# Patient Record
Sex: Male | Born: 1985 | Race: Black or African American | Hispanic: No | Marital: Single | Smoking: Current every day smoker
Health system: Southern US, Community
[De-identification: ages and names within clinical notes are randomized; demographics above are authoritative.]

## PROBLEM LIST (undated history)

## (undated) DIAGNOSIS — I319 Disease of pericardium, unspecified: Secondary | ICD-10-CM

## (undated) HISTORY — PX: ANKLE FRACTURE SURGERY: SHX122

## (undated) HISTORY — DX: Disease of pericardium, unspecified: I31.9

---

## 2013-12-19 ENCOUNTER — Encounter: Payer: Self-pay | Admitting: Internal Medicine

## 2013-12-19 ENCOUNTER — Ambulatory Visit: Payer: No Typology Code available for payment source | Attending: Internal Medicine | Admitting: Internal Medicine

## 2013-12-19 VITALS — BP 114/74 | HR 68 | Temp 98.1°F | Resp 16 | Ht 73.0 in | Wt 170.0 lb

## 2013-12-19 DIAGNOSIS — S82892A Other fracture of left lower leg, initial encounter for closed fracture: Secondary | ICD-10-CM

## 2013-12-19 DIAGNOSIS — M25579 Pain in unspecified ankle and joints of unspecified foot: Secondary | ICD-10-CM | POA: Insufficient documentation

## 2013-12-19 LAB — COMPREHENSIVE METABOLIC PANEL
ALT: 15 U/L (ref 0–53)
AST: 13 U/L (ref 0–37)
Albumin: 4.4 g/dL (ref 3.5–5.2)
Alkaline Phosphatase: 71 U/L (ref 39–117)
BILIRUBIN TOTAL: 0.2 mg/dL (ref 0.2–1.2)
BUN: 12 mg/dL (ref 6–23)
CO2: 30 meq/L (ref 19–32)
CREATININE: 0.94 mg/dL (ref 0.50–1.35)
Calcium: 9.4 mg/dL (ref 8.4–10.5)
Chloride: 103 mEq/L (ref 96–112)
Glucose, Bld: 89 mg/dL (ref 70–99)
Potassium: 4.1 mEq/L (ref 3.5–5.3)
Sodium: 140 mEq/L (ref 135–145)
Total Protein: 7.3 g/dL (ref 6.0–8.3)

## 2013-12-19 LAB — CBC WITH DIFFERENTIAL/PLATELET
Basophils Absolute: 0.1 10*3/uL (ref 0.0–0.1)
Basophils Relative: 1 % (ref 0–1)
EOS ABS: 0.2 10*3/uL (ref 0.0–0.7)
EOS PCT: 3 % (ref 0–5)
HCT: 42.3 % (ref 39.0–52.0)
Hemoglobin: 14.5 g/dL (ref 13.0–17.0)
LYMPHS ABS: 2.5 10*3/uL (ref 0.7–4.0)
Lymphocytes Relative: 39 % (ref 12–46)
MCH: 30.7 pg (ref 26.0–34.0)
MCHC: 34.3 g/dL (ref 30.0–36.0)
MCV: 89.6 fL (ref 78.0–100.0)
Monocytes Absolute: 0.8 10*3/uL (ref 0.1–1.0)
Monocytes Relative: 12 % (ref 3–12)
Neutro Abs: 2.8 10*3/uL (ref 1.7–7.7)
Neutrophils Relative %: 45 % (ref 43–77)
PLATELETS: 199 10*3/uL (ref 150–400)
RBC: 4.72 MIL/uL (ref 4.22–5.81)
RDW: 13.7 % (ref 11.5–15.5)
WBC: 6.3 10*3/uL (ref 4.0–10.5)

## 2013-12-19 LAB — SEDIMENTATION RATE: SED RATE: 1 mm/h (ref 0–16)

## 2013-12-19 LAB — C-REACTIVE PROTEIN

## 2013-12-19 MED ORDER — NAPROXEN 500 MG PO TABS: 500.0000 mg | ORAL_TABLET | Freq: Two times a day (BID) | ORAL | Status: DC

## 2013-12-19 MED ORDER — CIPROFLOXACIN HCL 500 MG PO TABS: 500.0000 mg | ORAL_TABLET | Freq: Two times a day (BID) | ORAL | Status: DC

## 2013-12-19 MED ORDER — DOXYCYCLINE HYCLATE 100 MG PO TABS
100.0000 mg | ORAL_TABLET | Freq: Two times a day (BID) | ORAL | Status: DC
Start: 2013-12-19 — End: 2014-03-19

## 2013-12-19 MED ORDER — TRAMADOL HCL 50 MG PO TABS: 50.0000 mg | ORAL_TABLET | Freq: Four times a day (QID) | ORAL | Status: DC | PRN
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
## 2013-12-20 ENCOUNTER — Ambulatory Visit (HOSPITAL_COMMUNITY)
Admission: EM | Admit: 2013-12-20 | Payer: No Typology Code available for payment source | Source: Ambulatory Visit | Admitting: Internal Medicine

## 2013-12-20 ENCOUNTER — Ambulatory Visit (HOSPITAL_COMMUNITY)
Admission: RE | Admit: 2013-12-20 | Discharge: 2013-12-20 | Disposition: A | Discharge status: Home / Self Care | Payer: No Typology Code available for payment source | Source: Ambulatory Visit | Attending: Internal Medicine | Admitting: Internal Medicine

## 2013-12-20 DIAGNOSIS — S82892A Other fracture of left lower leg, initial encounter for closed fracture: Secondary | ICD-10-CM

## 2013-12-20 DIAGNOSIS — M25579 Pain in unspecified ankle and joints of unspecified foot: Secondary | ICD-10-CM

## 2013-12-20 DIAGNOSIS — M19079 Primary osteoarthritis, unspecified ankle and foot: Secondary | ICD-10-CM | POA: Insufficient documentation

## 2013-12-22 ENCOUNTER — Ambulatory Visit: Payer: No Typology Code available for payment source

## 2014-01-19 ENCOUNTER — Ambulatory Visit: Payer: No Typology Code available for payment source | Admitting: Internal Medicine

## 2014-03-19 ENCOUNTER — Ambulatory Visit: Payer: No Typology Code available for payment source | Attending: Internal Medicine | Admitting: Internal Medicine

## 2014-03-19 ENCOUNTER — Encounter: Payer: Self-pay | Admitting: Internal Medicine

## 2014-03-19 VITALS — BP 152/88 | HR 85 | Temp 99.2°F | Resp 16 | Ht 74.0 in | Wt 185.0 lb

## 2014-03-19 DIAGNOSIS — S82899A Other fracture of unspecified lower leg, initial encounter for closed fracture: Secondary | ICD-10-CM | POA: Insufficient documentation

## 2014-03-19 DIAGNOSIS — S81809A Unspecified open wound, unspecified lower leg, initial encounter: Secondary | ICD-10-CM

## 2014-03-19 DIAGNOSIS — S99929A Unspecified injury of unspecified foot, initial encounter: Secondary | ICD-10-CM

## 2014-03-19 DIAGNOSIS — S99919A Unspecified injury of unspecified ankle, initial encounter: Secondary | ICD-10-CM

## 2014-03-19 DIAGNOSIS — S8990XA Unspecified injury of unspecified lower leg, initial encounter: Secondary | ICD-10-CM | POA: Insufficient documentation

## 2014-03-19 DIAGNOSIS — S82892A Other fracture of left lower leg, initial encounter for closed fracture: Secondary | ICD-10-CM

## 2014-03-19 DIAGNOSIS — X58XXXA Exposure to other specified factors, initial encounter: Secondary | ICD-10-CM | POA: Insufficient documentation

## 2014-03-19 DIAGNOSIS — S91009A Unspecified open wound, unspecified ankle, initial encounter: Secondary | ICD-10-CM

## 2014-03-19 DIAGNOSIS — S81802A Unspecified open wound, left lower leg, initial encounter: Secondary | ICD-10-CM | POA: Insufficient documentation

## 2014-03-19 DIAGNOSIS — S81009A Unspecified open wound, unspecified knee, initial encounter: Secondary | ICD-10-CM

## 2014-03-19 MED ORDER — CIPROFLOXACIN HCL 500 MG PO TABS: 500.0000 mg | ORAL_TABLET | Freq: Two times a day (BID) | ORAL | Status: DC

## 2014-03-19 MED ORDER — NAPROXEN 500 MG PO TABS: 500.0000 mg | ORAL_TABLET | Freq: Two times a day (BID) | ORAL | Status: DC

## 2014-03-19 MED ORDER — TRAMADOL HCL 50 MG PO TABS: 50.0000 mg | ORAL_TABLET | Freq: Four times a day (QID) | ORAL | Status: DC | PRN

## 2014-03-19 MED ORDER — DOXYCYCLINE HYCLATE 100 MG PO TABS: 100.0000 mg | ORAL_TABLET | Freq: Two times a day (BID) | ORAL | Status: DC

## 2014-03-19 NOTE — Progress Notes (Signed)
Pt here for f/u left leg pain with wound infection L lateral ankle  Draining yellow fluid intermit with dressing intact Need refills on Doxycycline and Ciprofloxacin Possibly need referral/imaging vss

## 2014-03-19 NOTE — Progress Notes (Unsigned)
Pt had office visit on 03/19/14 and dropped off Disability Paperwork to be filled out by PCP. Forms placed in inbox, please fax to number listed and call pt when it has been sent.

## 2014-03-19 NOTE — Progress Notes (Signed)
Patient ID: Andre Jenkins, male   DOB: Aug 27, 1986, 28 y.o.   MRN: [id number]   Andre Jenkins, is a 28 y.o. male  [id number]:[id number]  [id number]  DOB - 1986-04-12  Chief Complaint  Patient presents with  . Follow-up  . Leg Injury        Subjective:   Andre Jenkins is a 28 y.o. male here today for a follow up visit. Pt here for f/u left leg pain with wound infection L lateral ankle. Draining yellow fluid intermit with dressing intact. Need refills on Doxycycline and Ciprofloxacin. Possibly need referral/imaging  Patient has No headache, No chest pain, No abdominal pain - No Nausea, No new weakness tingling or numbness, No Cough - SOB.  Problem  Leg Wound, Left    ALLERGIES: No Known Allergies  PAST MEDICAL HISTORY: Past Medical History  Diagnosis Date  . Pericarditis     when he was 28years old    MEDICATIONS AT HOME: Prior to Admission medications   Medication Sig Start Date End Date Taking? Authorizing Provider  ciprofloxacin (CIPRO) 500 MG tablet Take 1 tablet (500 mg total) by mouth 2 (two) times daily. X 55month 03/19/14   Jeanann Lewandowsky, MD  doxycycline (VIBRA-TABS) 100 MG tablet Take 1 tablet (100 mg total) by mouth 2 (two) times daily. X 1 month 03/19/14   Jeanann Lewandowsky, MD  naproxen (NAPROSYN) 500 MG tablet Take 1 tablet (500 mg total) by mouth 2 (two) times daily with a meal. 03/19/14   Jeanann Lewandowsky, MD  traMADol (ULTRAM) 50 MG tablet Take 1 tablet (50 mg total) by mouth every 6 (six) hours as needed. 03/19/14   Jeanann Lewandowsky, MD     Objective:   Filed Vitals:   03/19/14 1622  BP: 152/88  Pulse: 85  Temp: 99.2 F (37.3 C)  TempSrc: Oral  Resp: 16  Height: 6\' 2"  (1.88 m)  Weight: 185 lb (83.915 kg)  SpO2: 96%    Exam General appearance : Awake, alert, not in any distress. Speech Clear. Not toxic looking HEENT: Atraumatic and Normocephalic, pupils equally reactive to light and accomodation Neck: supple, no JVD. No cervical lymphadenopathy.    Chest:Good air entry bilaterally, no added sounds  CVS: S1 S2 regular, no murmurs.  Abdomen: Bowel sounds present, Non tender and not distended with no gaurding, rigidity or rebound. Extremities: B/L Lower Ext shows no edema, both legs are warm to touch Neurology: Awake alert, and oriented X 3, CN II-XII intact, Non focal Minimal drainage from left leg wound site  Data Review No results found for this basename: HGBA1C     Assessment & Plan   1. Ankle fracture, left Healed  2. Leg wound, left  - ciprofloxacin (CIPRO) 500 MG tablet; Take 1 tablet (500 mg total) by mouth 2 (two) times daily. X 23month  Dispense: 60 tablet; Refill: 3 - doxycycline (VIBRA-TABS) 100 MG tablet; Take 1 tablet (100 mg total) by mouth 2 (two) times daily. X 1 month  Dispense: 60 tablet; Refill: 0 - traMADol (ULTRAM) 50 MG tablet; Take 1 tablet (50 mg total) by mouth every 6 (six) hours as needed.  Dispense: 60 tablet; Refill: 1 - naproxen (NAPROSYN) 500 MG tablet; Take 1 tablet (500 mg total) by mouth 2 (two) times daily with a meal.  Dispense: 60 tablet; Refill: 1 - Wound culture   Patient was counseled extensively on nutrition and exercise  Return in about 6 months (around 09/18/2014), or if symptoms worsen or fail to improve, for Follow up  Pain and comorbidities.  The patient was given clear instructions to go to ER or return to medical center if symptoms don't improve, worsen or new problems develop. The patient verbalized understanding. The patient was told to call to get lab results if they haven't heard anything in the next week.   This note has been created with Education officer, environmentalDragon speech recognition software and smart phrase technology. Any transcriptional errors are unintentional.    Jeanann Lewandowskylugbemiga Zollie Ellery, MD, MHA, FACP, [facility] [place], [place] [phone]   03/19/2014, 5:14 PM

## 2014-03-22 LAB — WOUND CULTURE
GRAM STAIN: NONE SEEN
GRAM STAIN: NONE SEEN

## 2014-03-25 ENCOUNTER — Telehealth: Payer: Self-pay | Admitting: Emergency Medicine

## 2014-03-25 NOTE — Telephone Encounter (Signed)
Left message for pt to call for negative wound culture results

## 2014-03-25 NOTE — Telephone Encounter (Signed)
Message copied by Darlis Loan on Wed Mar 25, 2014  2:03 PM ------      Message from: Jeanann Lewandowsky E      Created: Wed Mar 25, 2014 12:05 PM       Please inform patient that the wound culture result is negative so far. Will encourage him to continue antibiotics. ------

## 2014-07-02 ENCOUNTER — Telehealth: Payer: Self-pay

## 2014-07-02 DIAGNOSIS — S81802A Unspecified open wound, left lower leg, initial encounter: Secondary | ICD-10-CM

## 2014-07-02 MED ORDER — TRAMADOL HCL 50 MG PO TABS: 50.0000 mg | ORAL_TABLET | Freq: Four times a day (QID) | ORAL | Status: DC | PRN

## 2014-07-02 NOTE — Telephone Encounter (Signed)
Patient called requesting a refill on his tramadol Prescription refilled and patient is aware

## 2014-07-13 ENCOUNTER — Ambulatory Visit: Payer: Self-pay | Attending: Internal Medicine | Admitting: Internal Medicine

## 2014-07-13 ENCOUNTER — Encounter: Payer: Self-pay | Admitting: Internal Medicine

## 2014-07-13 VITALS — BP 114/73 | HR 70 | Temp 99.3°F | Resp 16 | Ht 74.0 in | Wt 188.0 lb

## 2014-07-13 DIAGNOSIS — S81809A Unspecified open wound, unspecified lower leg, initial encounter: Secondary | ICD-10-CM

## 2014-07-13 DIAGNOSIS — S8990XA Unspecified injury of unspecified lower leg, initial encounter: Secondary | ICD-10-CM | POA: Insufficient documentation

## 2014-07-13 DIAGNOSIS — S99919A Unspecified injury of unspecified ankle, initial encounter: Secondary | ICD-10-CM

## 2014-07-13 DIAGNOSIS — S99929A Unspecified injury of unspecified foot, initial encounter: Secondary | ICD-10-CM

## 2014-07-13 DIAGNOSIS — S81802A Unspecified open wound, left lower leg, initial encounter: Secondary | ICD-10-CM

## 2014-07-13 DIAGNOSIS — M25579 Pain in unspecified ankle and joints of unspecified foot: Secondary | ICD-10-CM | POA: Insufficient documentation

## 2014-07-13 DIAGNOSIS — Z791 Long term (current) use of non-steroidal anti-inflammatories (NSAID): Secondary | ICD-10-CM | POA: Insufficient documentation

## 2014-07-13 MED ORDER — TRAMADOL HCL 50 MG PO TABS: 50.0000 mg | ORAL_TABLET | Freq: Two times a day (BID) | ORAL | Status: DC | PRN

## 2014-07-13 NOTE — Progress Notes (Signed)
Patient ID: Andre Jenkins, male   DOB: 1985/11/15, 28 y.o.   MRN: [id number]   Andre Jenkins, is a 28 y.o. male  [id number]:[id number]  [id number]  DOB - [birth year]  Chief Complaint  Patient presents with  . Follow-up        Subjective:   Andre Jenkins is a 28 y.o. male here today for a follow up visit. Patient with no significant past medical history. Pt is here following up on his left ankle pain. Pt is having a lot of relief from the pain medication but is still in a great deal of pain. Patient has No headache, No chest pain, No abdominal pain - No Nausea, No new weakness tingling or numbness, No Cough - SOB.  No problems updated.  ALLERGIES: No Known Allergies  PAST MEDICAL HISTORY: Past Medical History  Diagnosis Date  . Pericarditis     when he was 28years old    MEDICATIONS AT HOME: Prior to Admission medications   Medication Sig Start Date End Date Taking? Authorizing Provider  ciprofloxacin (CIPRO) 500 MG tablet Take 1 tablet (500 mg total) by mouth 2 (two) times daily. X 35month 03/19/14  Yes Quentin Angst, MD  doxycycline (VIBRA-TABS) 100 MG tablet Take 1 tablet (100 mg total) by mouth 2 (two) times daily. X 1 month 03/19/14  Yes Quentin Angst, MD  naproxen (NAPROSYN) 500 MG tablet Take 1 tablet (500 mg total) by mouth 2 (two) times daily with a meal. 03/19/14  Yes Kesha Hurrell E Hyman Hopes, MD  traMADol (ULTRAM) 50 MG tablet Take 1 tablet (50 mg total) by mouth every 12 (twelve) hours as needed for moderate pain. 07/13/14  Yes Quentin Angst, MD     Objective:   Filed Vitals:   07/13/14 1632  BP: 114/73  Pulse: 70  Temp: 99.3 F (37.4 C)  TempSrc: Oral  Resp: 16  Height:  (1.88 m)  Weight: 188 lb (85.276 kg)  SpO2: 99%    Exam General appearance : Awake, alert, not in any distress. Speech Clear. Not toxic looking HEENT: Atraumatic and Normocephalic, pupils equally reactive to light and accomodation Neck: supple, no JVD. No cervical  lymphadenopathy.  Chest:Good air entry bilaterally, no added sounds  CVS: S1 S2 regular, no murmurs.  Abdomen: Bowel sounds present, Non tender and not distended with no gaurding, rigidity or rebound. Extremities: B/L Lower Ext shows no edema, both legs are warm to touch Neurology: Awake alert, and oriented X 3, CN II-XII intact, Non focal Skin:No Rash Wounds:N/A  Data Review No results found for this basename: HGBA1C     Assessment & Plan   1. Leg wound, left, initial encounter  - traMADol (ULTRAM) 50 MG tablet; Take 1 tablet (50 mg total) by mouth every 12 (twelve) hours as needed for moderate pain.  Dispense: 90 tablet; Refill: 1   Return in about 6 months (around 01/11/2015), or if symptoms worsen or fail to improve, for Follow up Pain and comorbidities.  The patient was given clear instructions to go to ER or return to medical center if symptoms don't improve, worsen or new problems develop. The patient verbalized understanding. The patient was told to call to get lab results if they haven't heard anything in the next week.   This note has been created with Education officer, environmental. Any transcriptional errors are unintentional.    Jeanann Lewandowsky, MD, MHA, FACP, FAAP [facility] [place], [place] [phone]  07/13/2014, 4:57 PM

## 2014-07-13 NOTE — Progress Notes (Signed)
Pt is here following up on his left ankle pain. Pt is having a lot of relief from the pain medication but is still in a great deal of pain.

## 2014-12-25 ENCOUNTER — Encounter (HOSPITAL_COMMUNITY): Payer: Self-pay | Admitting: Emergency Medicine

## 2014-12-25 ENCOUNTER — Emergency Department (HOSPITAL_COMMUNITY)
Admission: EM | Admit: 2014-12-25 | Discharge: 2014-12-25 | Disposition: A | Discharge status: Home / Self Care | Payer: No Typology Code available for payment source | Source: Home / Self Care | Attending: Emergency Medicine | Admitting: Emergency Medicine

## 2014-12-25 DIAGNOSIS — G8929 Other chronic pain: Secondary | ICD-10-CM

## 2014-12-25 DIAGNOSIS — M79606 Pain in leg, unspecified: Secondary | ICD-10-CM

## 2014-12-25 MED ORDER — DICLOFENAC SODIUM 75 MG PO TBEC: 75.0000 mg | DELAYED_RELEASE_TABLET | Freq: Two times a day (BID) | ORAL | Status: DC
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
## 2014-12-25 NOTE — Discharge Instructions (Signed)

## 2017-06-07 ENCOUNTER — Encounter (HOSPITAL_COMMUNITY): Payer: Self-pay | Admitting: Emergency Medicine

## 2017-06-07 ENCOUNTER — Emergency Department (HOSPITAL_COMMUNITY): Payer: Self-pay

## 2017-06-07 ENCOUNTER — Emergency Department (HOSPITAL_COMMUNITY)
Admission: EM | Admit: 2017-06-07 | Discharge: 2017-06-07 | Disposition: A | Discharge status: Home / Self Care | Payer: Self-pay | Attending: Emergency Medicine | Admitting: Emergency Medicine

## 2017-06-07 DIAGNOSIS — W3400XS Accidental discharge from unspecified firearms or gun, sequela: Secondary | ICD-10-CM | POA: Insufficient documentation

## 2017-06-07 DIAGNOSIS — Y929 Unspecified place or not applicable: Secondary | ICD-10-CM | POA: Insufficient documentation

## 2017-06-07 DIAGNOSIS — Y999 Unspecified external cause status: Secondary | ICD-10-CM | POA: Insufficient documentation

## 2017-06-07 DIAGNOSIS — F17210 Nicotine dependence, cigarettes, uncomplicated: Secondary | ICD-10-CM | POA: Insufficient documentation

## 2017-06-07 DIAGNOSIS — S91032S Puncture wound without foreign body, left ankle, sequela: Secondary | ICD-10-CM

## 2017-06-07 DIAGNOSIS — S91002S Unspecified open wound, left ankle, sequela: Secondary | ICD-10-CM | POA: Insufficient documentation

## 2017-06-07 DIAGNOSIS — Y939 Activity, unspecified: Secondary | ICD-10-CM | POA: Insufficient documentation

## 2017-06-07 DIAGNOSIS — Z79899 Other long term (current) drug therapy: Secondary | ICD-10-CM | POA: Insufficient documentation

## 2017-06-07 MED ORDER — IBUPROFEN 800 MG PO TABS: 800.0000 mg | ORAL_TABLET | Freq: Three times a day (TID) | ORAL | 0 refills | Status: DC

## 2017-06-07 NOTE — Discharge Instructions (Signed)
You may take 800 mg of ibuprofen with food every 8 hours to help with pain and inflammation.  Please continue to keep the area clean around the wound with warm soap and water daily. Please apply wet-to-dry dressings to the area and change them daily. You can do this by taking a piece of gauze soaking in saline and applied to the wound.  Please call Dr. Logan Bores office to schedule a follow-up appointment to ensure the wound is healing properly. You can call his office to discuss appointment availability. However, since you are unsure if you have medical insurance, we have scheduled an appointment for you at [facility] at 9:15 AM on 06/20/17. Please call their office if you are unable to keep this appointment.   If you develop new or worsening symptoms including fever, chills, or if the area around the wound becomes red, hot, or swollen, please return to the emergency department for reevaluation.
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
## 2017-06-07 NOTE — ED Triage Notes (Signed)
Per pt, states he had ankle surgery 10 years ago-states has had all pins removed-states since he has een working and on feet his ankle has been hurting and bleeding where he had surgery-

## 2017-06-07 NOTE — Care Management Note (Signed)
Case Management Note  CM consulted for no ins no PCP and need for wound care.  Spoke with pt and [facility] 5[facility] at 9:15 am.  Information for pharmacy and financial counseling placed on AVS.  Updated EDP.  No further CM needs noted at this time.

## 2017-06-20 ENCOUNTER — Ambulatory Visit: Payer: Self-pay | Admitting: Family Medicine

## 2018-12-10 ENCOUNTER — Encounter (HOSPITAL_COMMUNITY): Payer: Self-pay | Admitting: Emergency Medicine

## 2018-12-10 ENCOUNTER — Other Ambulatory Visit: Payer: Self-pay

## 2018-12-10 DIAGNOSIS — K0889 Other specified disorders of teeth and supporting structures: Secondary | ICD-10-CM | POA: Insufficient documentation

## 2018-12-10 DIAGNOSIS — F17210 Nicotine dependence, cigarettes, uncomplicated: Secondary | ICD-10-CM | POA: Insufficient documentation

## 2018-12-10 NOTE — ED Triage Notes (Signed)
Patient presents with left upper dental pain that began a few days ago. Patient states he had an abscess that busted and after eating a snickers the pain came back. Patient states associated head ache and jaw pain. Attempted to use ibuprofen with no relief.

## 2018-12-11 ENCOUNTER — Emergency Department (HOSPITAL_COMMUNITY)
Admission: EM | Admit: 2018-12-11 | Discharge: 2018-12-11 | Disposition: A | Discharge status: Home / Self Care | Payer: Self-pay | Attending: Emergency Medicine | Admitting: Emergency Medicine

## 2018-12-11 DIAGNOSIS — K0889 Other specified disorders of teeth and supporting structures: Secondary | ICD-10-CM

## 2018-12-11 MED ORDER — LIDOCAINE VISCOUS HCL 2 % MT SOLN: 15.0000 mL | OROMUCOSAL | 2 refills | Status: DC | PRN

## 2018-12-11 MED ORDER — PENICILLIN V POTASSIUM 500 MG PO TABS: 500.0000 mg | ORAL_TABLET | Freq: Four times a day (QID) | ORAL | 0 refills | Status: AC

## 2018-12-11 NOTE — ED Provider Notes (Signed)
[facility]-EMERGENCY DEPT Provider Note   CSN: [id number] Arrival date & time: 12/10/18  2304    History   Chief Complaint Chief Complaint  Patient presents with  . Dental Pain    HPI Andre Jenkins is a 33 y.o. male.     HPI   Andre Jenkins is a 33 y.o. male, with a history of pericarditis, presenting to the ED with left upper dental pain beginning yesterday. Pain is aching, moderate, radiating to left side of the face, and seems as though it is causing an intermittent left-sided headache.  Denies fever/chills, nausea/vomiting, facial swelling, difficulty breathing or swallowing, neck pain/swelling, or any other complaints.   Past Medical History:  Diagnosis Date  . Pericarditis    when he was 33years old    Patient Active Problem List   Diagnosis Date Noted  . Leg wound, left 03/19/2014  . Ankle fracture, left 12/19/2013  . Pain in joint, ankle and foot 12/19/2013    Past Surgical History:  Procedure Laterality Date  . ANKLE FRACTURE SURGERY     twice 2009        Home Medications    Prior to Admission medications   Medication Sig Start Date End Date Taking? Authorizing Provider  lidocaine (XYLOCAINE) 2 % solution Use as directed 15 mLs in the mouth or throat as needed for mouth pain. 12/11/18   Joy, Shawn C, PA-C  penicillin v potassium (VEETID) 500 MG tablet Take 1 tablet (500 mg total) by mouth 4 (four) times daily for 7 days. 12/11/18 12/18/18  Anselm Pancoast, PA-C    Family History Family History  Problem Relation Age of Onset  . Hypertension Mother   . Arthritis Mother   . Arthritis Father     Social History Social History   Tobacco Use  . Smoking status: Current Every Day Smoker    Packs/day: 0.50    Years: 8.00    Pack years: 4.00    Types: Cigarettes  . Smokeless tobacco: Never Used  Substance Use Topics  . Alcohol use: Yes    Comment: occasionally  . Drug use: Yes    Types: Marijuana    Comment: marijuana      Allergies   Patient has no known allergies.   Review of Systems Review of Systems  Constitutional: Negative for fever.  HENT: Positive for dental problem. Negative for facial swelling, sore throat and trouble swallowing.   Respiratory: Negative for shortness of breath.   Cardiovascular: Negative for chest pain.  Musculoskeletal: Negative for neck pain and neck stiffness.     Physical Exam Updated Vital Signs BP 122/85 (BP Location: Left Arm)   Pulse 66   Temp 98.4 F (36.9 C) (Oral)   Resp 18   SpO2 99%   Physical Exam Vitals signs and nursing note reviewed.  Constitutional:      General: He is not in acute distress.    Appearance: He is well-developed. He is not diaphoretic.  HENT:     Head: Normocephalic and atraumatic.     Mouth/Throat:     Comments: Erosion almost to the gumline of what appears to be the left maxillary first molar.  However, there does not appear to be dentin or pulp exposure.  No noted area of swelling or fluctuance.  No trismus.  Mouth opening to at least 3 finger widths.  Handles oral secretions without difficulty.  No noted facial swelling.  No sublingual swelling.  No swelling or tenderness to the  submental or submandibular regions.  No swelling or tenderness into the soft tissues of the neck. Eyes:     Conjunctiva/sclera: Conjunctivae normal.  Neck:     Musculoskeletal: Neck supple. No neck rigidity.  Cardiovascular:     Rate and Rhythm: Normal rate and regular rhythm.  Pulmonary:     Effort: Pulmonary effort is normal.  Lymphadenopathy:     Cervical: No cervical adenopathy.  Skin:    General: Skin is warm and dry.     Coloration: Skin is not pale.  Neurological:     Mental Status: He is alert.  Psychiatric:        Behavior: Behavior normal.      ED Treatments / Results  Labs (all labs ordered are listed, but only abnormal results are displayed) Labs Reviewed - No data to display  EKG None  Radiology No results  found.  Procedures Procedures (including critical care time)  Medications Ordered in ED Medications - No data to display   Initial Impression / Assessment and Plan / ED Course  I have reviewed the triage vital signs and the nursing notes.  Pertinent labs & imaging results that were available during my care of the patient were reviewed by me and considered in my medical decision making (see chart for details).        Patient presents with left upper dental pain beginning yesterday.  Doubt sepsis or Ludwig's angioedema.  No abscess apparent on exam.  Patient was offered a dental block with explanation, but declined.  Antibiotic therapy initiated.  Dental follow-up recommended.  Resources given. The patient was given instructions for home care as well as return precautions. Patient voices understanding of these instructions, accepts the plan, and is comfortable with discharge.   Final Clinical Impressions(s) / ED Diagnoses   Final diagnoses:  Pain, dental    ED Discharge Orders         Ordered    penicillin v potassium (VEETID) 500 MG tablet  4 times daily     12/11/18 0140    lidocaine (XYLOCAINE) 2 % solution  As needed     12/11/18 0140           Anselm Pancoast, PA-C 12/12/18 1116    Devoria Albe, MD 12/13/18 2304

## 2018-12-11 NOTE — Discharge Instructions (Addendum)
°  Dental Pain °You have been seen today for dental pain. You should follow up with a dentist as soon as possible. This problem will not resolve on its own without the care of a dentist. Use ibuprofen or naproxen for pain. Use the viscous lidocaine for mouth pain. Swish with the lidocaine and spit it out. Do not swallow it. °You should also swish with a homemade salt water solution, twice a day.  Make this solution by mixing 8 ounces of warm water with about half a teaspoon of salt. °Antiinflammatory medications: Take 600 mg of ibuprofen every 6 hours or 440 mg (over the counter dose) to 500 mg (prescription dose) of naproxen every 12 hours for the next 3 days. After this time, these medications may be used as needed for pain. Take these medications with food to avoid upset stomach. Choose only one of these medications, do not take them together. °Acetaminophen (generic for Tylenol): Should you continue to have additional pain while taking the ibuprofen or naproxen, you may add in acetaminophen as needed. Your daily total maximum amount of acetaminophen from all sources should be limited to 4000mg/day for persons without liver problems, or 2000mg/day for those with liver problems. ° °Please take all of your antibiotics until finished!   You may develop abdominal discomfort or diarrhea from the antibiotic.  You may help offset this with probiotics which you can buy or get in yogurt. Do not eat or take the probiotics until 2 hours after your antibiotic.  ° °For prescription assistance, may try using prescription discount sites or apps, such as goodrx.com ° °Dental Resource Guide ° °[facility] °[address], [address] °[place], [place] [address] °[phone] ° °[facility] °[address] °[place], [place] [address] °[phone] ° °[facility] °[address] °[place], [place] [address] °[phone]. [phone] ° °[facility] °[address], [address] °[place], [place] [address] °[phone] ° °[facility] °[address] °[place], [place] [address] °[phone] ° °[employer] °Www.[facility].[employer].edu/patientcare/studentclinics/becomepatient ° °[employer] [facility] [employer] [facility] °[address] °[place], [place] [address] °[phone] ° °Website for free, low-income, or sliding scale dental services in [place]: °www.freedentalcare.us ° °To find a dentist in [place] and surrounding areas: °www.ncdental.org/for-the-public/find-a-dentist ° °[facility] °http://www.ncdental.org/meetings-events/[place] ° ° Medicaid Dentist °https://dma.ncdhhs.gov/find-a-doctor/medicaid-dental-providers ° °

## 2018-12-12 ENCOUNTER — Encounter (HOSPITAL_COMMUNITY): Payer: Self-pay | Admitting: Emergency Medicine

## 2019-10-08 ENCOUNTER — Emergency Department (HOSPITAL_COMMUNITY): Payer: Self-pay

## 2019-10-08 ENCOUNTER — Encounter (HOSPITAL_COMMUNITY): Payer: Self-pay | Admitting: Emergency Medicine

## 2019-10-08 ENCOUNTER — Other Ambulatory Visit: Payer: Self-pay

## 2019-10-08 ENCOUNTER — Emergency Department (HOSPITAL_COMMUNITY)
Admission: EM | Admit: 2019-10-08 | Discharge: 2019-10-08 | Disposition: A | Discharge status: Home / Self Care | Payer: Self-pay | Attending: Emergency Medicine | Admitting: Emergency Medicine

## 2019-10-08 DIAGNOSIS — R112 Nausea with vomiting, unspecified: Secondary | ICD-10-CM | POA: Insufficient documentation

## 2019-10-08 DIAGNOSIS — F17210 Nicotine dependence, cigarettes, uncomplicated: Secondary | ICD-10-CM | POA: Insufficient documentation

## 2019-10-08 DIAGNOSIS — R0789 Other chest pain: Secondary | ICD-10-CM

## 2019-10-08 DIAGNOSIS — R072 Precordial pain: Secondary | ICD-10-CM | POA: Insufficient documentation

## 2019-10-08 LAB — CBC
HCT: 44.3 % (ref 39.0–52.0)
Hemoglobin: 14.7 g/dL (ref 13.0–17.0)
MCH: 30.2 pg (ref 26.0–34.0)
MCHC: 33.2 g/dL (ref 30.0–36.0)
MCV: 91.2 fL (ref 80.0–100.0)
Platelets: 200 10*3/uL (ref 150–400)
RBC: 4.86 MIL/uL (ref 4.22–5.81)
RDW: 12.2 % (ref 11.5–15.5)
WBC: 9.1 10*3/uL (ref 4.0–10.5)
nRBC: 0 % (ref 0.0–0.2)

## 2019-10-08 LAB — BASIC METABOLIC PANEL
Anion gap: 10 (ref 5–15)
BUN: 12 mg/dL (ref 6–20)
CO2: 25 mmol/L (ref 22–32)
Calcium: 9.7 mg/dL (ref 8.9–10.3)
Chloride: 104 mmol/L (ref 98–111)
Creatinine, Ser: 0.9 mg/dL (ref 0.61–1.24)
GFR calc Af Amer: >60 mL/min (ref >60)
GFR calc non Af Amer: >60 mL/min (ref >60)
Glucose, Bld: 113 mg/dL — ABNORMAL HIGH (ref 70–99)
Potassium: 3.6 mmol/L (ref 3.5–5.1)
Sodium: 139 mmol/L (ref 135–145)

## 2019-10-08 LAB — TROPONIN I (HIGH SENSITIVITY): Troponin I (High Sensitivity): 3 ng/L (ref <18)

## 2019-10-08 MED ORDER — DICLOFENAC SODIUM 1 % EX GEL: 4.0000 g | Freq: Four times a day (QID) | CUTANEOUS | 0 refills | Status: DC

## 2019-10-08 MED ORDER — ONDANSETRON 4 MG PO TBDP
4.0000 mg | ORAL_TABLET | Freq: Once | ORAL | Status: AC
Start: 2019-10-08 — End: 2019-10-08
  Administered 2019-10-08: 4 mg via ORAL
  Filled 2019-10-08: qty 1

## 2019-10-08 MED ORDER — SODIUM CHLORIDE 0.9% FLUSH: 3.0000 mL | Freq: Once | INTRAVENOUS | Status: DC

## 2019-10-08 MED ORDER — ONDANSETRON 4 MG PO TBDP: 4.0000 mg | ORAL_TABLET | Freq: Three times a day (TID) | ORAL | 0 refills | Status: DC | PRN

## 2019-10-08 MED ORDER — ALUM & MAG HYDROXIDE-SIMETH 200-200-20 MG/5ML PO SUSP
30.0000 mL | Freq: Once | ORAL | Status: AC
Start: 2019-10-08 — End: 2019-10-08
  Administered 2019-10-08: 30 mL via ORAL
  Filled 2019-10-08: qty 30

## 2019-10-08 NOTE — ED Provider Notes (Signed)
[facility] EMERGENCY DEPARTMENT Provider Note   CSN: [id number] Arrival date & time: 10/08/19  1121     History Chief Complaint  Patient presents with  . Chest Pain    Andre Jenkins is a 33 y.o. male.  33 yo M with a chief complaints of chest pain.  Described as a pressure worse with deep breathing or lying in certain positions.  Going on for about 2 weeks.  Worsened last night to where he had multiple episodes of vomiting.  Got worse when he laid back flat on his left side.  Seem to improve on his right side.  Also worse with moving his left arm.  Denies trauma denies cough denies fever.  Denies history of MI.  Denies hypertension hyperlipidemia diabetes or family history of MI.  He does have a history of smoking.  Denies history of PE or DVT denies hemoptysis denies unilateral lower extremity edema denies recent surgery immobilization or cancer.  Denies testosterone use.  He does have a history of pericarditis.  He is a bit vague on the exact symptoms that he had, said he was given medicine to make him urinate more and then seemed to resolve.  Was also told that it was related to a bacteria was started on antibiotics.  This was in [place].  The history is provided by the patient.  Chest Pain Pain location:  Substernal area Pain quality: pressure   Pain radiates to:  Neck Pain severity:  Moderate Onset quality:  Gradual Duration:  2 weeks Timing:  Intermittent Progression:  Waxing and waning Chronicity:  New Relieved by:  Nothing Worsened by:  Deep breathing and certain positions Ineffective treatments:  None tried Associated symptoms: nausea and vomiting   Associated symptoms: no abdominal pain, no fever, no headache, no palpitations and no shortness of breath        Past Medical History:  Diagnosis Date  . Pericarditis    when he was 33years old    Patient Active Problem List   Diagnosis Date Noted  . Leg wound, left 03/19/2014  .  Ankle fracture, left 12/19/2013  . Pain in joint, ankle and foot 12/19/2013    Past Surgical History:  Procedure Laterality Date  . ANKLE FRACTURE SURGERY     twice 2009       Family History  Problem Relation Age of Onset  . Hypertension Mother   . Arthritis Mother   . Arthritis Father     Social History   Tobacco Use  . Smoking status: Current Every Day Smoker    Packs/day: 0.50    Years: 8.00    Pack years: 4.00    Types: Cigarettes  . Smokeless tobacco: Never Used  Substance Use Topics  . Alcohol use: Yes    Comment: occasionally  . Drug use: Yes    Types: Marijuana    Comment: marijuana    Home Medications Prior to Admission medications   Medication Sig Start Date End Date Taking? Authorizing Provider  diclofenac Sodium (VOLTAREN) 1 % GEL Apply 4 g topically 4 (four) times daily. 10/08/19   Melene Plan, DO  lidocaine (XYLOCAINE) 2 % solution Use as directed 15 mLs in the mouth or throat as needed for mouth pain. 12/11/18   Joy, Shawn C, PA-C  ondansetron (ZOFRAN ODT) 4 MG disintegrating tablet Take 1 tablet (4 mg total) by mouth every 8 (eight) hours as needed for nausea or vomiting. 10/08/19   Melene Plan, DO  Allergies    Patient has no known allergies.  Review of Systems   Review of Systems  Constitutional: Negative for chills and fever.  HENT: Negative for congestion and facial swelling.   Eyes: Negative for discharge and visual disturbance.  Respiratory: Negative for shortness of breath.   Cardiovascular: Positive for chest pain. Negative for palpitations.  Gastrointestinal: Positive for nausea and vomiting. Negative for abdominal pain and diarrhea.  Musculoskeletal: Negative for arthralgias and myalgias.  Skin: Negative for color change and rash.  Neurological: Negative for tremors, syncope and headaches.  Psychiatric/Behavioral: Negative for confusion and dysphoric mood.    Physical Exam Updated Vital Signs BP (!) 142/97 (BP Location: Right  Arm)   Pulse 87   Temp 98.4 F (36.9 C) (Oral)   Resp 18   SpO2 100%   Physical Exam Vitals and nursing note reviewed.  Constitutional:      Appearance: He is well-developed.  HENT:     Head: Normocephalic and atraumatic.  Eyes:     Pupils: Pupils are equal, round, and reactive to light.  Neck:     Vascular: No JVD.  Cardiovascular:     Rate and Rhythm: Normal rate and regular rhythm.     Heart sounds: No murmur. No friction rub. No gallop.   Pulmonary:     Effort: No respiratory distress.     Breath sounds: No wheezing.  Chest:     Chest wall: No tenderness.     Comments: No appreciable tenderness to palpation of the chest wall.  The patient does have some trapezius tenderness.  Reproduces the patient's symptoms. Abdominal:     General: There is no distension.     Tenderness: There is no guarding or rebound.  Musculoskeletal:        General: Normal range of motion.     Cervical back: Normal range of motion and neck supple.  Skin:    Coloration: Skin is not pale.     Findings: No rash.  Neurological:     Mental Status: He is alert and oriented to person, place, and time.  Psychiatric:        Behavior: Behavior normal.     ED Results / Procedures / Treatments   Labs (all labs ordered are listed, but only abnormal results are displayed) Labs Reviewed  BASIC METABOLIC PANEL - Abnormal; Notable for the following components:      Result Value   Glucose, Bld 113 (*)    All other components within normal limits  CBC  TROPONIN I (HIGH SENSITIVITY)  TROPONIN I (HIGH SENSITIVITY)    EKG EKG Interpretation  Date/Time:  Wednesday October 08 2019 11:23:34 EST Ventricular Rate:  83 PR Interval:  156 QRS Duration: 88 QT Interval:  370 QTC Calculation: 434 R Axis:   81 Text Interpretation: Normal sinus rhythm with sinus arrhythmia Inferior infarct , age undetermined Abnormal ECG No old tracing to compare Confirmed by Melene Plan [phone]) on 10/08/2019 1:47:13  PM   Radiology DG Chest 2 View  Result Date: 10/08/2019 CLINICAL DATA:  33 year old male with chest and left shoulder pain for 1 week. History of pericarditis. EXAM: CHEST - 2 VIEW COMPARISON:  None. FINDINGS: Normal lung volumes and mediastinal contours. Visualized tracheal air column is within normal limits. EKG button artifact. Both lungs appear clear. No pneumothorax or pleural effusion. No osseous abnormality identified. Negative visible bowel gas pattern. IMPRESSION: Negative.  No cardiopulmonary abnormality. Electronically Signed   By: Odessa Fleming M.D.   On:  10/08/2019 11:57    Procedures Procedures (including critical care time) Discussed smoking cessation with patient and was they were offerred resources to help stop.  Total time was 5 min CPT code [id number].   Medications Ordered in ED Medications  sodium chloride flush (NS) 0.9 % injection 3 mL (has no administration in time range)  alum & mag hydroxide-simeth (MAALOX/MYLANTA) 200-200-20 MG/5ML suspension 30 mL (has no administration in time range)  ondansetron (ZOFRAN-ODT) disintegrating tablet 4 mg (has no administration in time range)    ED Course  I have reviewed the triage vital signs and the nursing notes.  Pertinent labs & imaging results that were available during my care of the patient were reviewed by me and considered in my medical decision making (see chart for details).    MDM Rules/Calculators/A&P                      33 yo M with atypical chest pain.  Patient symptoms sound most like reflux on history.  He does have a muscular component to this that is reproducible with palpation of the trapezius.  His EKG is without ischemia.  Troponin is negative.  He has had 2 weeks of symptoms without significant change in the past 6 hours.  I feel no reason to repeat his troponin.  On further questioning of the onset of his symptoms he also was seen in ED and had 2 troponins that were negative.  He also had a Covid test that was  negative.  I will treat him for possible reflux as the symptoms seem to worsen with food and in certain positions.  PCP follow-up.  Lance Mussyrone Lythgoe was evaluated in Emergency Department on 10/08/2019 for the symptoms described in the history of present illness. He/she was evaluated in the context of the global COVID-19 pandemic, which necessitated consideration that the patient might be at risk for infection with the SARS-CoV-2 virus that causes COVID-19. Institutional protocols and algorithms that pertain to the evaluation of patients at risk for COVID-19 are in a state of rapid change based on information released by regulatory bodies including the CDC and federal and state organizations. These policies and algorithms were followed during the patient's care in the ED.   2:30 PM:  I have discussed the diagnosis/risks/treatment options with the patient and believe the pt to be eligible for discharge home to follow-up with PCP. We also discussed returning to the ED immediately if new or worsening sx occur. We discussed the sx which are most concerning (e.g., sudden worsening pain, fever, inability to tolerate by mouth) that necessitate immediate return. Medications administered to the patient during their visit and any new prescriptions provided to the patient are listed below.  Medications given during this visit Medications  sodium chloride flush (NS) 0.9 % injection 3 mL (has no administration in time range)  alum & mag hydroxide-simeth (MAALOX/MYLANTA) 200-200-20 MG/5ML suspension 30 mL (has no administration in time range)  ondansetron (ZOFRAN-ODT) disintegrating tablet 4 mg (has no administration in time range)     The patient appears reasonably screen and/or stabilized for discharge and I doubt any other medical condition or other [facility] requiring further screening, evaluation, or treatment in the ED at this time prior to discharge.    Final Clinical Impression(s) / ED Diagnoses Final diagnoses:    Atypical chest pain    Rx / DC Orders ED Discharge Orders         Ordered    ondansetron [facility](ZOFRAN  ODT) 4 MG disintegrating tablet  Every 8 hours PRN     10/08/19 1424    diclofenac Sodium (VOLTAREN) 1 % GEL  4 times daily     10/08/19 [place], Waylon Hershey, DO 10/08/19 1430

## 2019-10-08 NOTE — Discharge Instructions (Signed)
Try zantac or pepcid twice a day.  Try to avoid things that may make this worse, most commonly these are spicy foods tomato based products fatty foods chocolate and peppermint.  Alcohol and tobacco can also make this worse.  Return to the emergency department for sudden worsening pain fever or inability to eat or drink.  Try voltaren gel for you neck pain.  Please return for worsening symptoms or symptoms upon exertion.

## 2019-10-08 NOTE — ED Notes (Signed)
Discharge instructions and prescription discussed with Pt. Pt verbalized understanding. Pt stable and ambulatory.    

## 2019-10-08 NOTE — ED Triage Notes (Signed)
Pt reports generalized chest pain x1 week, unrelated symptoms. Hx of pericarditis, states this feels similar. Denies any recent sick contacts, cough, fevers or chills. A/ox4, resp e/u, nad.

## 2020-05-05 IMAGING — DX DG CHEST 2V
2 series · 2 of 2 positions shown · non-contrast
Comparison: None.

CLINICAL DATA: 33-year-old male with chest and left shoulder pain
for 1 week. History of pericarditis.

EXAM:
CHEST - 2 VIEW

[w chest pa]
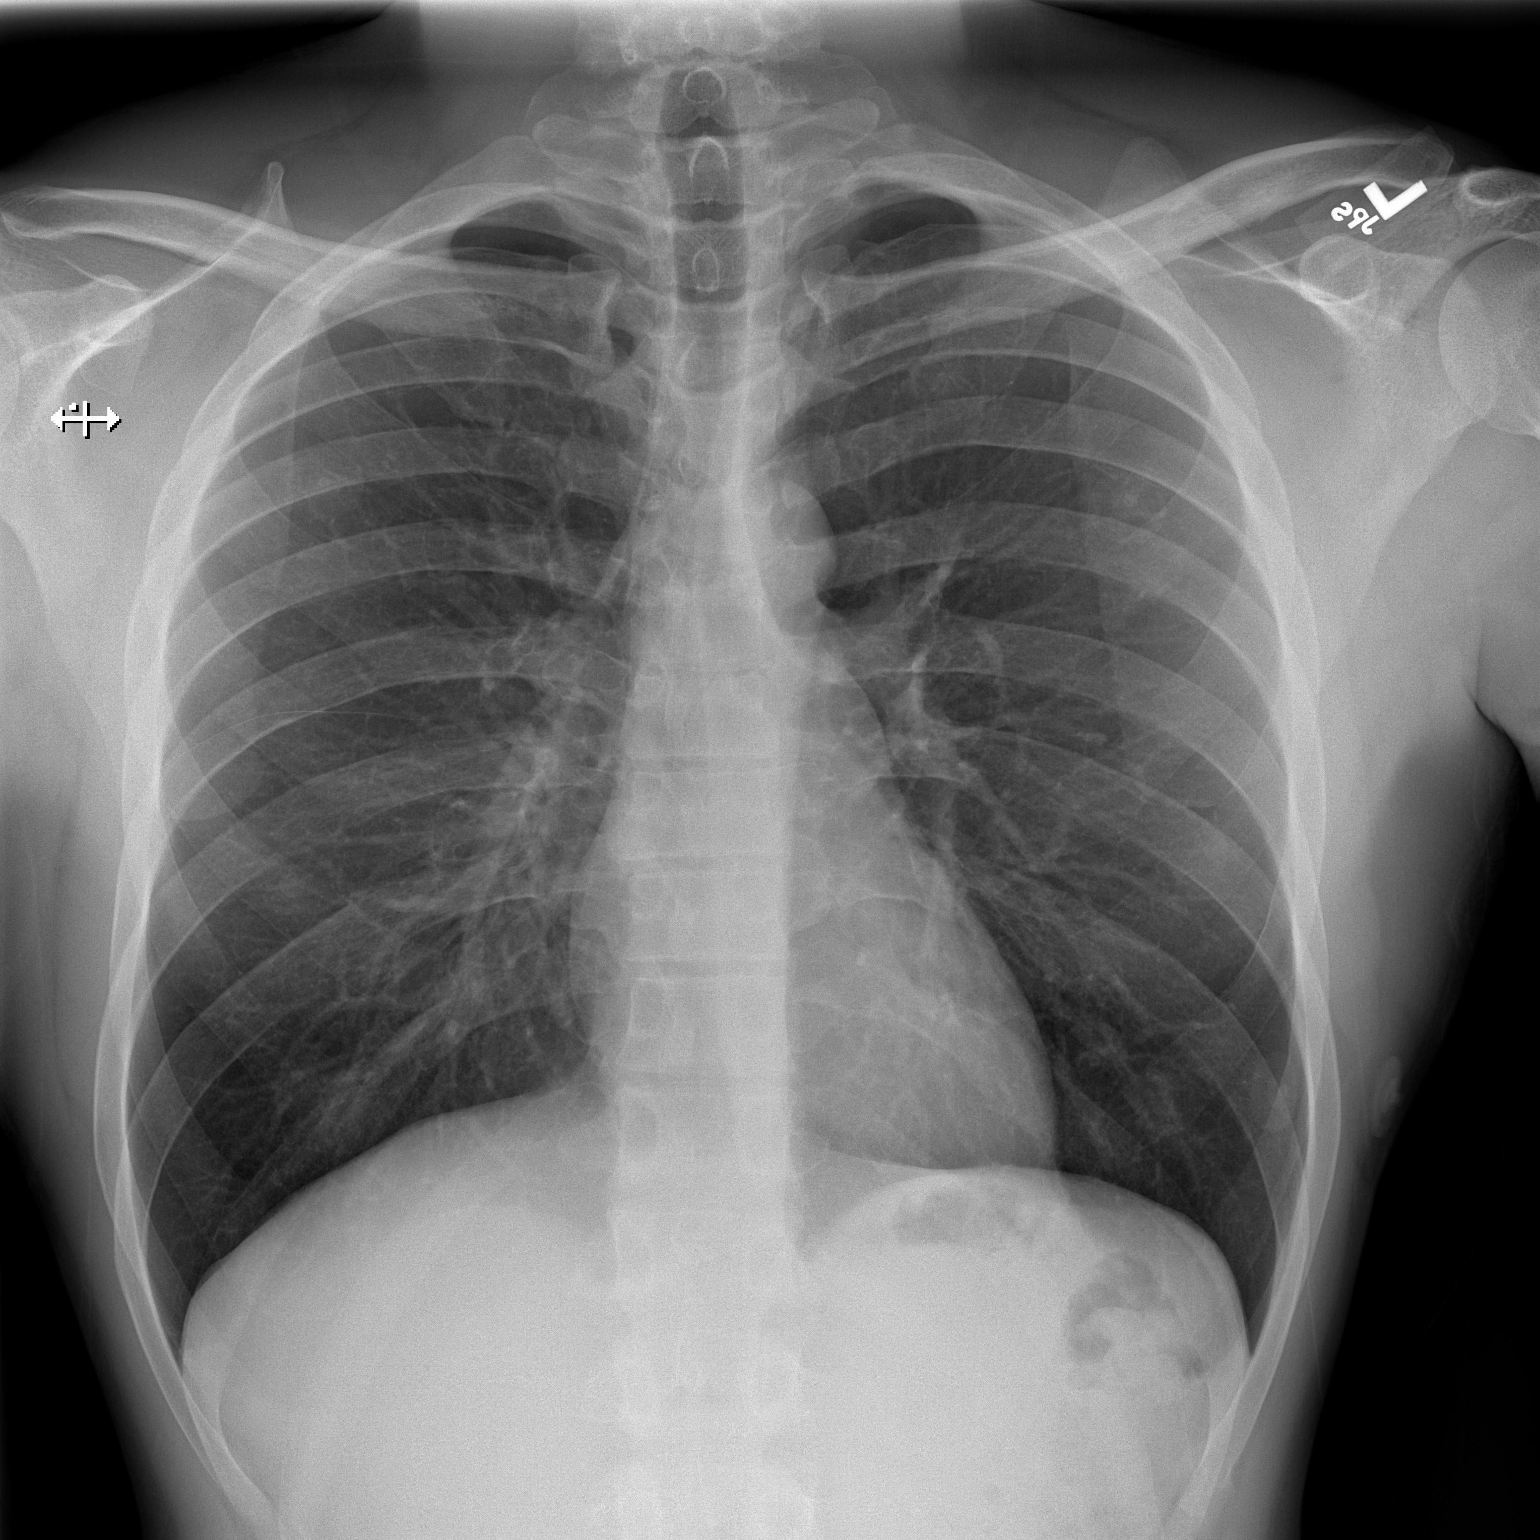

[w chest lat]
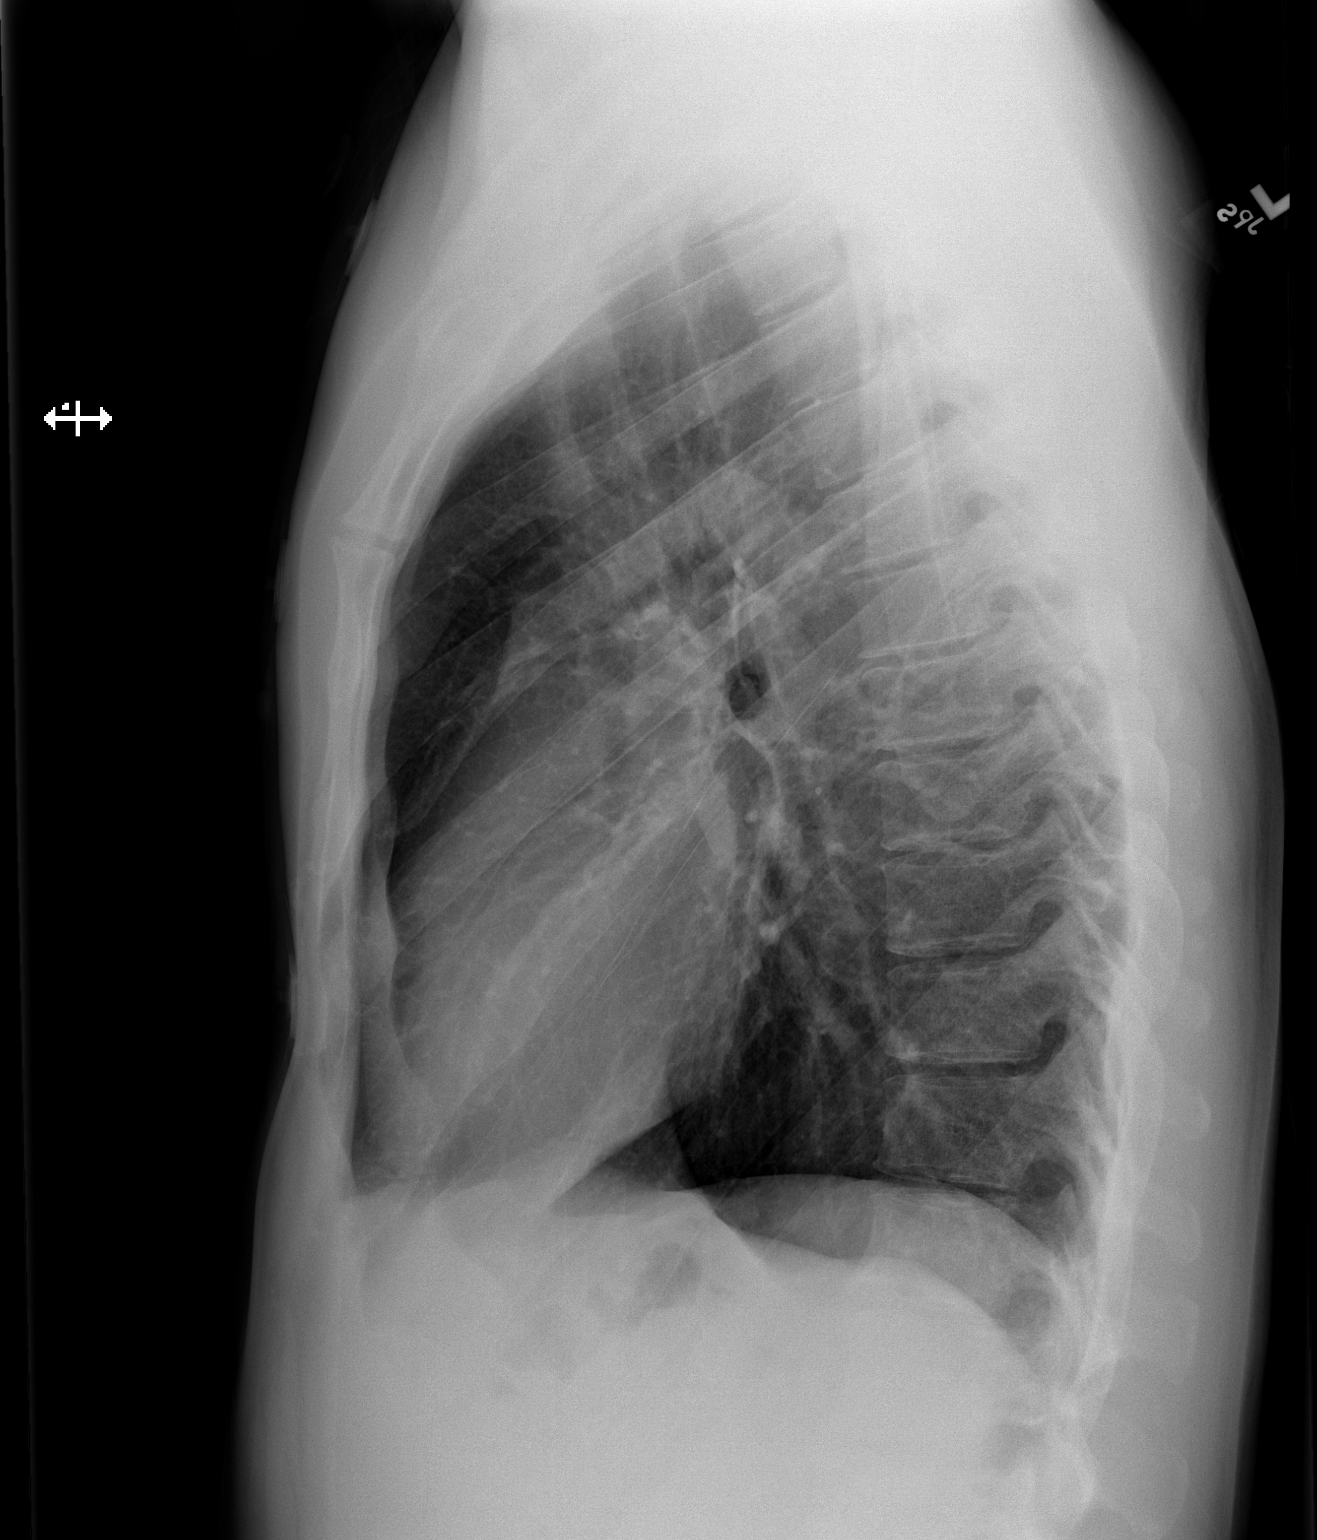

[2 of 2 positions shown; findings below may reference images not displayed]

FINDINGS: Normal lung volumes and mediastinal contours. Visualized tracheal
air column is within normal limits. EKG button artifact. Both lungs
appear clear. No pneumothorax or pleural effusion. No osseous
abnormality identified. Negative visible bowel gas pattern.
IMPRESSION: Negative.  No cardiopulmonary abnormality.

## 2021-08-01 ENCOUNTER — Ambulatory Visit (HOSPITAL_COMMUNITY)
Admission: EM | Admit: 2021-08-01 | Discharge: 2021-08-01 | Disposition: A | Discharge status: Home / Self Care | Payer: Self-pay | Attending: Physician Assistant | Admitting: Physician Assistant

## 2021-08-01 ENCOUNTER — Encounter (HOSPITAL_COMMUNITY): Payer: Self-pay | Admitting: Emergency Medicine

## 2021-08-01 ENCOUNTER — Other Ambulatory Visit: Payer: Self-pay

## 2021-08-01 DIAGNOSIS — L97322 Non-pressure chronic ulcer of left ankle with fat layer exposed: Secondary | ICD-10-CM

## 2021-08-01 DIAGNOSIS — L089 Local infection of the skin and subcutaneous tissue, unspecified: Secondary | ICD-10-CM

## 2021-08-01 DIAGNOSIS — T148XXA Other injury of unspecified body region, initial encounter: Secondary | ICD-10-CM

## 2021-08-01 MED ORDER — DOXYCYCLINE HYCLATE 100 MG PO CAPS: 100.0000 mg | ORAL_CAPSULE | Freq: Two times a day (BID) | ORAL | 0 refills | Status: DC

## 2021-08-01 MED ORDER — MUPIROCIN 2 % EX OINT: 1.0000 "application " | TOPICAL_OINTMENT | Freq: Every day | CUTANEOUS | 0 refills | Status: DC

## 2021-08-01 NOTE — ED Provider Notes (Signed)
[facility]    CSN: [id number] Arrival date & time: 08/01/21  0802      History   Chief Complaint Chief Complaint  Patient presents with   Wound Check    HPI Andre Jenkins is a 35 y.o. male.   Patient presents today with a 1 week history of recurrent leg ulcer.  Patient has a history of ankle fracture surgery in this area with multiple pins and screws that have led to chronic wounds in the past.  This had healed but he believes that the increased activity related to his job at [employer] led to infection/recurrent wound.  He reports several days ago he developed a large pustule that has since drained leaving an ulcer.  He denies any significant pain at rest but does report pain is rated 4 on a 0-10 pain scale with ambulation, localized to wound, described as aching, no aggravating or alleviating factors identified.  He denies any history of diabetes or immunosuppression.  He denies any systemic symptoms including fever, nausea, vomiting, dizziness, syncope, body aches.  He has been keeping area clean but has not been applying any topical medications.  He has previously seen wound specialist but not in the last several years.   Past Medical History:  Diagnosis Date   Pericarditis    when he was 35years old    Patient Active Problem List   Diagnosis Date Noted   Leg wound, left 03/19/2014   Ankle fracture, left 12/19/2013   Pain in joint, ankle and foot 12/19/2013    Past Surgical History:  Procedure Laterality Date   ANKLE FRACTURE SURGERY     twice 2009       Home Medications    Prior to Admission medications   Medication Sig Start Date End Date Taking? Authorizing Provider  doxycycline (VIBRAMYCIN) 100 MG capsule Take 1 capsule (100 mg total) by mouth 2 (two) times daily. 08/01/21  Yes Oliana Gowens, Denny Peon K, PA-C  mupirocin ointment (BACTROBAN) 2 % Apply 1 application topically daily. 08/01/21  Yes Dede Dobesh K, PA-C  diclofenac Sodium (VOLTAREN) 1 % GEL Apply 4  g topically 4 (four) times daily. 10/08/19   Melene Plan, DO  lidocaine (XYLOCAINE) 2 % solution Use as directed 15 mLs in the mouth or throat as needed for mouth pain. 12/11/18   Joy, Shawn C, PA-C  ondansetron (ZOFRAN ODT) 4 MG disintegrating tablet Take 1 tablet (4 mg total) by mouth every 8 (eight) hours as needed for nausea or vomiting. 10/08/19   Melene Plan, DO    Family History Family History  Problem Relation Age of Onset   Hypertension Mother    Arthritis Mother    Arthritis Father     Social History Social History   Tobacco Use   Smoking status: Every Day    Packs/day: 0.50    Years: 8.00    Pack years: 4.00    Types: Cigarettes   Smokeless tobacco: Never  Substance Use Topics   Alcohol use: Yes    Comment: occasionally   Drug use: Yes    Types: Marijuana    Comment: marijuana     Allergies   Patient has no known allergies.   Review of Systems Review of Systems  Constitutional:  Positive for activity change. Negative for appetite change, fatigue and fever.  Respiratory:  Negative for cough and shortness of breath.   Cardiovascular:  Negative for chest pain.  Gastrointestinal:  Negative for abdominal pain, diarrhea, nausea and vomiting.  Musculoskeletal:  Negative for arthralgias and myalgias.  Skin:  Positive for wound. Negative for color change.  Neurological:  Negative for dizziness, light-headedness and headaches.    Physical Exam Triage Vital Signs ED Triage Vitals  Enc Vitals Group     BP 08/01/21 0823 127/78     Pulse Rate 08/01/21 0823 67     Resp 08/01/21 0823 16     Temp 08/01/21 0823 99.1 F (37.3 C)     Temp Source 08/01/21 0823 Oral     SpO2 08/01/21 0823 97 %     Weight --      Height --      Head Circumference --      Peak Flow --      Pain Score 08/01/21 0824 4     Pain Loc --      Pain Edu? --      Excl. in GC? --    No data found.  Updated Vital Signs BP 127/78   Pulse 67   Temp 99.1 F (37.3 C) (Oral)   Resp 16    SpO2 97%   Visual Acuity Right Eye Distance:   Left Eye Distance:   Bilateral Distance:    Right Eye Near:   Left Eye Near:    Bilateral Near:     Physical Exam Vitals reviewed.  Constitutional:      General: He is awake.     Appearance: Normal appearance. He is well-developed and normal weight. He is not ill-appearing.     Comments: Very pleasant male appears stated age in no acute distress sitting comfortably in exam room  HENT:     Head: Normocephalic and atraumatic.  Cardiovascular:     Rate and Rhythm: Normal rate and regular rhythm.     Pulses:          Dorsalis pedis pulses are 2+ on the right side and 2+ on the left side.       Posterior tibial pulses are 2+ on the right side and 1+ on the left side.     Heart sounds: Normal heart sounds, S1 normal and S2 normal. No murmur heard. Pulmonary:     Effort: Pulmonary effort is normal.     Breath sounds: Normal breath sounds. No stridor. No wheezing, rhonchi or rales.     Comments: Clear to auscultation bilaterally Skin:    Findings: Wound present.     Comments: 3 cm x 3 cm ulcerated lesion noted medial left ankle with serous drainage.  No purulent drainage noted.  Surrounding edema and warmth to touch noted.  No streaking or evidence of lymphangitis.  Neurological:     Mental Status: He is alert.  Psychiatric:        Behavior: Behavior is cooperative.       UC Treatments / Results  Labs (all labs ordered are listed, but only abnormal results are displayed) Labs Reviewed - No data to display  EKG   Radiology No results found.  Procedures Procedures (including critical care time)  Medications Ordered in UC Medications - No data to display  Initial Impression / Assessment and Plan / UC Course  I have reviewed the triage vital signs and the nursing notes.  Pertinent labs & imaging results that were available during my care of the patient were reviewed by me and considered in my medical decision making (see  chart for details).      Vital signs are normal today with no indication for emergent evaluation.  Discussed  with patient potential need for x-ray to ensure there is no bony involvement given depth of ulcer but patient declined this due to concern for cost as symptoms only present for a few days.  We will treat with antibiotics; patient prescribed doxycycline 100 mg twice daily for 10 days.  He was instructed to keep area clean and apply Bactroban with dressing changes.  Given history of recurrent wound in this area as well as current appearance, recommended he follow-up with wound management and he was given contact information for local provider with instruction to call them to schedule an appointment as soon as possible.  Discussed that someone should evaluate wound within 48 hours if this cannot be done by PCP or specialist he can return to our clinic.  If he has any worsening symptoms he must go to the emergency room.  Strict return precautions given to which he expressed understanding.  Final Clinical Impressions(s) / UC Diagnoses   Final diagnoses:  Skin ulcer of left ankle with fat layer exposed (HCC)  Wound infection     Discharge Instructions      We are going to treat infection with doxycycline 100 mg twice daily for 10 days.  Stay out of the sun while on this medication.  Keep area clean with soap and water and apply Bactroban with dressing changes.  It is very important that you follow-up with wound clinic as we discussed.  Please call them to schedule an appointment as soon as possible.  Keep your leg elevated and keep wound clean and covered.  If you have any worsening symptoms including increased drainage, spread of wound, increased pain, systemic symptoms such as nausea/vomiting/fever/body aches you need to be seen immediately.     ED Prescriptions     Medication Sig Dispense Auth. Provider   doxycycline (VIBRAMYCIN) 100 MG capsule Take 1 capsule (100 mg total) by mouth 2  (two) times daily. 20 capsule Kayveon Lennartz K, PA-C   mupirocin ointment (BACTROBAN) 2 % Apply 1 application topically daily. 22 g Denyse Fillion K, PA-C      PDMP not reviewed this encounter.   Jeani Hawking, PA-C 08/01/21 4315

## 2021-08-01 NOTE — Discharge Instructions (Signed)
We are going to treat infection with doxycycline 100 mg twice daily for 10 days.  Stay out of the sun while on this medication.  Keep area clean with soap and water and apply Bactroban with dressing changes.  It is very important that you follow-up with wound clinic as we discussed.  Please call them to schedule an appointment as soon as possible.  Keep your leg elevated and keep wound clean and covered.  If you have any worsening symptoms including increased drainage, spread of wound, increased pain, systemic symptoms such as nausea/vomiting/fever/body aches you need to be seen immediately.

## 2021-08-01 NOTE — ED Triage Notes (Signed)
PT had surgery on ankle years ago, he now works at [employer] and says a previous surgical area is now swollen and draining

## 2021-08-20 ENCOUNTER — Other Ambulatory Visit: Payer: Self-pay

## 2021-08-20 ENCOUNTER — Encounter (HOSPITAL_COMMUNITY): Payer: Self-pay | Admitting: Emergency Medicine

## 2021-08-20 ENCOUNTER — Ambulatory Visit (HOSPITAL_COMMUNITY)
Admission: EM | Admit: 2021-08-20 | Discharge: 2021-08-20 | Disposition: A | Discharge status: Home / Self Care | Payer: Self-pay | Attending: Family Medicine | Admitting: Family Medicine

## 2021-08-20 DIAGNOSIS — R6883 Chills (without fever): Secondary | ICD-10-CM | POA: Insufficient documentation

## 2021-08-20 DIAGNOSIS — J069 Acute upper respiratory infection, unspecified: Secondary | ICD-10-CM | POA: Insufficient documentation

## 2021-08-20 DIAGNOSIS — R52 Pain, unspecified: Secondary | ICD-10-CM | POA: Insufficient documentation

## 2021-08-20 LAB — RESPIRATORY PANEL BY PCR

## 2021-08-20 MED ORDER — OSELTAMIVIR PHOSPHATE 75 MG PO CAPS: 75.0000 mg | ORAL_CAPSULE | Freq: Two times a day (BID) | ORAL | 0 refills | Status: DC

## 2021-08-20 NOTE — ED Triage Notes (Signed)
Started feeling bad one week ago.  Patient has had chills and feeling hot.  Intermittent headache.  Patient mentions 2 weeks ago have a stool that he noticed bright red blood.  One time event , but has him concerned

## 2021-08-20 NOTE — ED Notes (Signed)
Called in lobby, no answered.  °

## 2021-08-20 NOTE — ED Provider Notes (Signed)
[facility]    CSN: [id number] Arrival date & time: 08/20/21  1456      History   Chief Complaint Chief Complaint  Patient presents with   Chills    HPI Andre Jenkins is a 34 y.o. male.   Patient presenting today with several day history of fatigue, malaise, chills, sweats, body aches.  Now starting to have nasal congestion, sore throat as well.  Denies chest pain, shortness of breath, abdominal pain, nausea vomiting or diarrhea.  So far has not taken anything over-the-counter for symptoms.  No known chronic medical problems.  No known sick contacts.   Past Medical History:  Diagnosis Date   Pericarditis    when he was 35years old    Patient Active Problem List   Diagnosis Date Noted   Leg wound, left 03/19/2014   Ankle fracture, left 12/19/2013   Pain in joint, ankle and foot 12/19/2013    Past Surgical History:  Procedure Laterality Date   ANKLE FRACTURE SURGERY     twice 2009       Home Medications    Prior to Admission medications   Medication Sig Start Date End Date Taking? Authorizing Provider  oseltamivir (TAMIFLU) 75 MG capsule Take 1 capsule (75 mg total) by mouth every 12 (twelve) hours. 08/20/21  Yes Particia Nearing, PA-C  diclofenac Sodium (VOLTAREN) 1 % GEL Apply 4 g topically 4 (four) times daily. Patient not taking: Reported on 08/20/2021 10/08/19   Melene Plan, DO  doxycycline (VIBRAMYCIN) 100 MG capsule Take 1 capsule (100 mg total) by mouth 2 (two) times daily. Patient not taking: Reported on 08/20/2021 08/01/21   Raspet, Denny Peon K, PA-C  lidocaine (XYLOCAINE) 2 % solution Use as directed 15 mLs in the mouth or throat as needed for mouth pain. Patient not taking: Reported on 08/20/2021 12/11/18   Harolyn Rutherford C, PA-C  mupirocin ointment (BACTROBAN) 2 % Apply 1 application topically daily. Patient not taking: Reported on 08/20/2021 08/01/21   Raspet, Erin K, PA-C  ondansetron (ZOFRAN ODT) 4 MG disintegrating tablet Take 1 tablet (4 mg  total) by mouth every 8 (eight) hours as needed for nausea or vomiting. Patient not taking: Reported on 08/20/2021 10/08/19   Melene Plan, DO    Family History Family History  Problem Relation Age of Onset   Hypertension Mother    Arthritis Mother    Arthritis Father     Social History Social History   Tobacco Use   Smoking status: Every Day    Packs/day: 0.50    Years: 8.00    Pack years: 4.00    Types: Cigarettes   Smokeless tobacco: Never  Vaping Use   Vaping Use: Never used  Substance Use Topics   Alcohol use: Yes    Comment: occasionally   Drug use: Yes    Types: Marijuana    Comment: marijuana     Allergies   Patient has no known allergies.   Review of Systems Review of Systems Per HPI  Physical Exam Triage Vital Signs ED Triage Vitals  Enc Vitals Group     BP 08/20/21 1731 130/77     Pulse Rate 08/20/21 1731 81     Resp 08/20/21 1731 18     Temp 08/20/21 1731 98.7 F (37.1 C)     Temp Source 08/20/21 1731 Oral     SpO2 08/20/21 1731 97 %     Weight --      Height --  Head Circumference --      Peak Flow --      Pain Score 08/20/21 1728 0     Pain Loc --      Pain Edu? --      Excl. in [place]? --    No data found.  Updated Vital Signs BP 130/77 (BP Location: Left Arm)   Pulse 81   Temp 98.7 F (37.1 C) (Oral)   Resp 18   SpO2 97%   Visual Acuity Right Eye Distance:   Left Eye Distance:   Bilateral Distance:    Right Eye Near:   Left Eye Near:    Bilateral Near:     Physical Exam Vitals and nursing note reviewed.  Constitutional:      Appearance: Normal appearance.  HENT:     Head: Atraumatic.     Right Ear: Tympanic membrane normal.     Left Ear: Tympanic membrane normal.     Nose: Rhinorrhea present.     Mouth/Throat:     Mouth: Mucous membranes are moist.     Pharynx: Oropharynx is clear. Posterior oropharyngeal erythema present.  Eyes:     Extraocular Movements: Extraocular movements intact.     Conjunctiva/sclera:  Conjunctivae normal.  Cardiovascular:     Rate and Rhythm: Normal rate and regular rhythm.     Heart sounds: Normal heart sounds.  Pulmonary:     Effort: Pulmonary effort is normal. No respiratory distress.     Breath sounds: Normal breath sounds. No wheezing or rales.  Musculoskeletal:        General: Normal range of motion.     Cervical back: Normal range of motion and neck supple.  Skin:    General: Skin is warm and dry.  Neurological:     General: No focal deficit present.     Mental Status: He is oriented to person, place, and time.  Psychiatric:        Mood and Affect: Mood normal.        Thought Content: Thought content normal.        Judgment: Judgment normal.     UC Treatments / Results  Labs (all labs ordered are listed, but only abnormal results are displayed) Labs Reviewed  RESPIRATORY PANEL BY PCR    EKG   Radiology No results found.  Procedures Procedures (including critical care time)  Medications Ordered in UC Medications - No data to display  Initial Impression / Assessment and Plan / UC Course  I have reviewed the triage vital signs and the nursing notes.  Pertinent labs & imaging results that were available during my care of the patient were reviewed by me and considered in my medical decision making (see chart for details).     Vitals and exam reassuring, suspect viral illness with high suspicion for influenza.  Respiratory panel pending for further evaluation, will start Tamiflu in case influenza.  Discussed over-the-counter fever reducers, fluids, DayQuil, NyQuil and close monitoring for improvement.  Work note given with quarantine protocol.  Return for acutely worsening symptoms.  Final Clinical Impressions(s) / UC Diagnoses   Final diagnoses:  Chills  Body aches  Viral URI   Discharge Instructions   None    ED Prescriptions     Medication Sig Dispense Auth. Provider   oseltamivir (TAMIFLU) 75 MG capsule Take 1 capsule (75 mg  total) by mouth every 12 (twelve) hours. 10 capsule Volney American, [place]      PDMP not reviewed this encounter.  Volney American, [place] 08/20/21 1749

## 2021-09-19 ENCOUNTER — Encounter (HOSPITAL_COMMUNITY): Payer: Self-pay

## 2021-09-19 ENCOUNTER — Other Ambulatory Visit: Payer: Self-pay

## 2021-09-19 ENCOUNTER — Ambulatory Visit (HOSPITAL_COMMUNITY)
Admission: EM | Admit: 2021-09-19 | Discharge: 2021-09-19 | Disposition: A | Discharge status: Home / Self Care | Payer: Self-pay | Attending: Family Medicine | Admitting: Family Medicine

## 2021-09-19 DIAGNOSIS — M25572 Pain in left ankle and joints of left foot: Secondary | ICD-10-CM

## 2021-09-19 NOTE — ED Triage Notes (Signed)
Pt presents to [facility] for ankle pain. He is requesting a note due to missing work.
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
## 2021-10-18 ENCOUNTER — Encounter: Payer: Self-pay | Admitting: Emergency Medicine

## 2021-10-18 ENCOUNTER — Other Ambulatory Visit: Payer: Self-pay

## 2021-10-18 ENCOUNTER — Ambulatory Visit
Admission: EM | Admit: 2021-10-18 | Discharge: 2021-10-18 | Disposition: A | Discharge status: Home / Self Care | Payer: Self-pay

## 2021-10-18 DIAGNOSIS — J069 Acute upper respiratory infection, unspecified: Secondary | ICD-10-CM

## 2021-10-18 NOTE — ED Triage Notes (Signed)
Patient is requesting a note to return to work today and moving forward.  Patient had been out of work with flu like sx's, wanted to make sure no fever, etc.

## 2021-10-18 NOTE — ED Provider Notes (Signed)
[facility]    CSN: [id number] Arrival date & time: 10/18/21  1251      History   Chief Complaint Chief Complaint  Patient presents with   Work Note    HPI Andre Jenkins is a 36 y.o. male.   Patient here today for evaluation of clearing upper respiratory infection.  He states he had flulike symptoms and needs a note to return to work.  He states he has been symptom-free for the last 2 to 3 days.  He has not had fever in this time.  The history is provided by the patient.   Past Medical History:  Diagnosis Date   Pericarditis    when he was 36years old    Patient Active Problem List   Diagnosis Date Noted   Leg wound, left 03/19/2014   Ankle fracture, left 12/19/2013   Pain in joint, ankle and foot 12/19/2013    Past Surgical History:  Procedure Laterality Date   ANKLE FRACTURE SURGERY     twice 2009       Home Medications    Prior to Admission medications   Medication Sig Start Date End Date Taking? Authorizing Provider  diclofenac Sodium (VOLTAREN) 1 % GEL Apply 4 g topically 4 (four) times daily. Patient not taking: Reported on 08/20/2021 10/08/19   Deno Etienne, DO  doxycycline (VIBRAMYCIN) 100 MG capsule Take 1 capsule (100 mg total) by mouth 2 (two) times daily. Patient not taking: Reported on 08/20/2021 08/01/21   Raspet, Junie Panning K, PA-C  lidocaine (XYLOCAINE) 2 % solution Use as directed 15 mLs in the mouth or throat as needed for mouth pain. Patient not taking: Reported on 08/20/2021 12/11/18   Arlean Hopping C, PA-C  mupirocin ointment (BACTROBAN) 2 % Apply 1 application topically daily. Patient not taking: Reported on 08/20/2021 08/01/21   Raspet, Erin K, PA-C  ondansetron (ZOFRAN ODT) 4 MG disintegrating tablet Take 1 tablet (4 mg total) by mouth every 8 (eight) hours as needed for nausea or vomiting. Patient not taking: Reported on 08/20/2021 10/08/19   Deno Etienne, DO  oseltamivir (TAMIFLU) 75 MG capsule Take 1 capsule (75 mg total) by mouth every  12 (twelve) hours. 08/20/21   Volney American, PA-C    Family History Family History  Problem Relation Age of Onset   Hypertension Mother    Arthritis Mother    Arthritis Father     Social History Social History   Tobacco Use   Smoking status: Every Day    Packs/day: 0.50    Years: 8.00    Pack years: 4.00    Types: Cigarettes   Smokeless tobacco: Never  Vaping Use   Vaping Use: Never used  Substance Use Topics   Alcohol use: Yes    Comment: occasionally   Drug use: Yes    Types: Marijuana    Comment: marijuana     Allergies   Patient has no known allergies.   Review of Systems Review of Systems  Constitutional:  Negative for chills and fever.  HENT:  Negative for congestion and sore throat.   Eyes:  Negative for discharge and redness.  Respiratory:  Negative for cough and shortness of breath.   Gastrointestinal:  Negative for diarrhea, nausea and vomiting.  Neurological:  Negative for numbness.    Physical Exam Triage Vital Signs ED Triage Vitals [10/18/21 1504]  Enc Vitals Group     BP 129/76     Pulse Rate 77     Resp 20  Temp 98.1 F (36.7 C)     Temp Source Oral     SpO2 98 %     Weight 190 lb (86.2 kg)     Height 6\' 2"  (1.88 m)     Head Circumference      Peak Flow      Pain Score 0     Pain Loc      Pain Edu?      Excl. in [place]?    No data found.  Updated Vital Signs BP 129/76 (BP Location: Left Arm)    Pulse 77    Temp 98.1 F (36.7 C) (Oral)    Resp 20    Ht 6\' 2"  (1.88 m)    Wt 190 lb (86.2 kg)    SpO2 98%    BMI 24.39 kg/m   :     Physical Exam Vitals and nursing note reviewed.  Constitutional:      General: He is not in acute distress.    Appearance: Normal appearance. He is not ill-appearing.  HENT:     Head: Normocephalic and atraumatic.  Eyes:     Conjunctiva/sclera: Conjunctivae normal.  Cardiovascular:     Rate and Rhythm: Normal rate.  Pulmonary:     Effort: Pulmonary effort is normal.  Neurological:      Mental Status: He is alert.  Psychiatric:        Mood and Affect: Mood normal.        Behavior: Behavior normal.        Thought Content: Thought content normal.     UC Treatments / Results  Labs (all labs ordered are listed, but only abnormal results are displayed) Labs Reviewed - No data to display  EKG   Radiology No results found.  Procedures Procedures (including critical care time)  Medications Ordered in UC Medications - No data to display  Initial Impression / Assessment and Plan / UC Course  I have reviewed the triage vital signs and the nursing notes.  Pertinent labs & imaging results that were available during my care of the patient were reviewed by me and considered in my medical decision making (see chart for details).    Infection seems to have cleared without complication.  Note provided as needed to return to work.  Final Clinical Impressions(s) / UC Diagnoses   Final diagnoses:  Acute upper respiratory infection   Discharge Instructions   None    ED Prescriptions   None    PDMP not reviewed this encounter.   Francene Finders, PA-C 10/18/21 1531

## 2022-06-13 ENCOUNTER — Ambulatory Visit (HOSPITAL_COMMUNITY)
Admission: EM | Admit: 2022-06-13 | Discharge: 2022-06-13 | Disposition: A | Discharge status: Home / Self Care | Payer: Self-pay | Attending: Internal Medicine | Admitting: Internal Medicine

## 2022-06-13 ENCOUNTER — Ambulatory Visit (INDEPENDENT_AMBULATORY_CARE_PROVIDER_SITE_OTHER): Payer: Self-pay

## 2022-06-13 ENCOUNTER — Encounter (HOSPITAL_COMMUNITY): Payer: Self-pay | Admitting: Emergency Medicine

## 2022-06-13 DIAGNOSIS — M25572 Pain in left ankle and joints of left foot: Secondary | ICD-10-CM

## 2022-06-13 DIAGNOSIS — S91002A Unspecified open wound, left ankle, initial encounter: Secondary | ICD-10-CM

## 2022-06-13 DIAGNOSIS — L089 Local infection of the skin and subcutaneous tissue, unspecified: Secondary | ICD-10-CM

## 2022-06-13 MED ORDER — AMOXICILLIN-POT CLAVULANATE 875-125 MG PO TABS: 1.0000 | ORAL_TABLET | Freq: Two times a day (BID) | ORAL | 0 refills | Status: DC

## 2022-06-13 MED ORDER — MUPIROCIN 2 % EX OINT: 1.0000 | TOPICAL_OINTMENT | Freq: Every day | CUTANEOUS | 0 refills | Status: DC

## 2022-06-13 MED ORDER — IBUPROFEN 800 MG PO TABS
800.0000 mg | ORAL_TABLET | Freq: Once | ORAL | Status: AC
  Administered 2022-06-13: 800 mg via ORAL

## 2022-06-13 MED ORDER — IBUPROFEN 600 MG PO TABS: 600.0000 mg | ORAL_TABLET | Freq: Four times a day (QID) | ORAL | 0 refills | Status: DC | PRN

## 2022-06-13 MED ORDER — IBUPROFEN 800 MG PO TABS
ORAL_TABLET | ORAL | Status: AC
  Filled 2022-06-13: qty 1

## 2022-06-13 NOTE — ED Triage Notes (Signed)
Pt reports a wound on his left ankle. States he has had multiple surgeries years ago and the wound was healing and closed prior to yesterday. States he noticed the wound had opened up and was bleeding and noticed a light tan drainage.

## 2022-06-13 NOTE — ED Provider Notes (Signed)
[facility]    CSN: [id number] Arrival date & time: 06/13/22  1502      History   Chief Complaint Chief Complaint  Patient presents with  . Wound Check    HPI Andre Jenkins is a 36 y.o. male.   Patient presents urgent care for evaluation of nonhealing wound to the left lateral aspect of the ankle.  Patient states that over 10 years ago, he broke his left ankle and needed surgical repair.  After the surgery, he had to wear a cast for 7 months and experienced some postoperative complications related to the pins and rods in his ankle.  The skin to the left lateral ankle never fully healed back to normal and patient states that there has been a "pink spot" that is normally nondraining and does not cause him any problems there ever since his surgery.  Yesterday, patient states that the area began to become painful and swollen while he was at work.  States he recently got a second job and has been on his feet more than normal.  This morning, the area began to drain tan and yellowish-white fluid.  He has been keeping the area covered so as to prevent infection.  States his pain is currently an 8 on a scale of 0-10 to the ankle.  He is able to walk on the ankle although reports significant tenderness.  Denies recent trauma/injury to the ankle and recent falls.  Denies numbness and tingling to the bilateral lower extremities.  He is not diabetic and has not attempted any use of pain medications prior to arrival urgent care.    Past Medical History:  Diagnosis Date  . Pericarditis    when he was 36years old    Patient Active Problem List   Diagnosis Date Noted  . Leg wound, left 03/19/2014  . Ankle fracture, left 12/19/2013  . Pain in joint, ankle and foot 12/19/2013    Past Surgical History:  Procedure Laterality Date  . ANKLE FRACTURE SURGERY     twice 2009       Home Medications    Prior to Admission medications   Medication Sig Start Date End Date Taking?  Authorizing Provider  diclofenac Sodium (VOLTAREN) 1 % GEL Apply 4 g topically 4 (four) times daily. Patient not taking: Reported on 08/20/2021 10/08/19   Melene Plan, DO  doxycycline (VIBRAMYCIN) 100 MG capsule Take 1 capsule (100 mg total) by mouth 2 (two) times daily. Patient not taking: Reported on 08/20/2021 08/01/21   Raspet, Denny Peon K, PA-C  lidocaine (XYLOCAINE) 2 % solution Use as directed 15 mLs in the mouth or throat as needed for mouth pain. Patient not taking: Reported on 08/20/2021 12/11/18   Harolyn Rutherford C, PA-C  mupirocin ointment (BACTROBAN) 2 % Apply 1 application topically daily. Patient not taking: Reported on 08/20/2021 08/01/21   Raspet, Erin K, PA-C  ondansetron (ZOFRAN ODT) 4 MG disintegrating tablet Take 1 tablet (4 mg total) by mouth every 8 (eight) hours as needed for nausea or vomiting. Patient not taking: Reported on 08/20/2021 10/08/19   Melene Plan, DO  oseltamivir (TAMIFLU) 75 MG capsule Take 1 capsule (75 mg total) by mouth every 12 (twelve) hours. 08/20/21   Particia Nearing, PA-C    Family History Family History  Problem Relation Age of Onset  . Hypertension Mother   . Arthritis Mother   . Arthritis Father     Social History Social History   Tobacco Use  .  Smoking status: Every Day    Packs/day: 0.50    Years: 8.00    Total pack years: 4.00    Types: Cigarettes  . Smokeless tobacco: Never  Vaping Use  . Vaping Use: Never used  Substance Use Topics  . Alcohol use: Yes    Comment: occasionally  . Drug use: Yes    Types: Marijuana    Comment: marijuana     Allergies   Patient has no known allergies.   Review of Systems Review of Systems Per HPI  Physical Exam Triage Vital Signs ED Triage Vitals [06/13/22 1531]  Enc Vitals Group     BP (!) 133/91     Pulse Rate 74     Resp 18     Temp 98.1 F (36.7 C)     Temp Source Oral     SpO2 97 %     Weight      Height      Head Circumference      Peak Flow      Pain Score 8     Pain Loc       Pain Edu?      Excl. in [place]?    No data found.  Updated Vital Signs BP (!) 133/91 (BP Location: Right Arm)   Pulse 74   Temp 98.1 F (36.7 C) (Oral)   Resp 18   SpO2 97%   Visual Acuity Right Eye Distance:   Left Eye Distance:   Bilateral Distance:    Right Eye Near:   Left Eye Near:    Bilateral Near:     Physical Exam Vitals and nursing note reviewed.  Constitutional:      Appearance: Normal appearance. He is not ill-appearing or toxic-appearing.     Comments: Very pleasant patient sitting on exam in position of comfort table in no acute distress.   HENT:     Head: Normocephalic and atraumatic.     Right Ear: Hearing and external ear normal.     Left Ear: Hearing and external ear normal.     Nose: Nose normal.     Mouth/Throat:     Lips: Pink.     Mouth: Mucous membranes are moist.  Eyes:     General: Lids are normal. Vision grossly intact. Gaze aligned appropriately.     Extraocular Movements: Extraocular movements intact.     Conjunctiva/sclera: Conjunctivae normal.  Pulmonary:     Effort: Pulmonary effort is normal.  Abdominal:     Palpations: Abdomen is soft.  Musculoskeletal:     Cervical back: Neck supple.     Comments: 5/5 strength against resistance with dorsi flexion and plantarflexion of the bilateral lower extremities.  +2 dorsalis pedis pulses bilaterally.  There is an area of erythema and drainage to the left lateral ankle as shown below in image.  Deferred image below for further detail of wound.  Sensation is intact.  Skin:    General: Skin is warm and dry.     Capillary Refill: Capillary refill takes less than 2 seconds.     Findings: No rash.  Neurological:     General: No focal deficit present.     Mental Status: He is alert and oriented to person, place, and time. Mental status is at baseline.     Cranial Nerves: No dysarthria or facial asymmetry.     Gait: Gait is intact.  Psychiatric:        Mood and Affect: Mood normal.  Speech: Speech normal.        Behavior: Behavior normal.        Thought Content: Thought content normal.        Judgment: Judgment normal.       UC Treatments / Results  Labs (all labs ordered are listed, but only abnormal results are displayed) Labs Reviewed - No data to display  EKG   Radiology No results found.  Procedures Procedures (including critical care time)  Medications Ordered in UC Medications  ibuprofen (ADVIL) tablet 800 mg (has no administration in time range)    Initial Impression / Assessment and Plan / UC Course  I have reviewed the triage vital signs and the nursing notes.  Pertinent labs & imaging results that were available during my care of the patient were reviewed by me and considered in my medical decision making (see chart for details).     *** Final Clinical Impressions(s) / UC Diagnoses   Final diagnoses:  None   Discharge Instructions   None    ED Prescriptions   None    PDMP not reviewed this encounter.

## 2022-06-13 NOTE — Discharge Instructions (Addendum)
Your x-ray was negative for acute bony or skin abnormality. You may take ibuprofen every 6 hours as needed for pain.  Take this with food to avoid stomach upset. Apply mupirocin ointment to your wound twice daily for the next 5 to 7 days and keep the wound covered with a nonstick dressing.  Change the dressing twice daily.  Take Augmentin twice daily for the next 7 days with food.  This is an antibiotic and will help with the infection to your wound.  Follow-up with dermatology by calling the number on your paperwork to schedule an appointment.  If you develop any new or worsening symptoms or do not improve in the next 2 to 3 days, please return.  If your symptoms are severe, please go to the emergency room.  Follow-up with your primary care provider for further evaluation and management of your symptoms as well as ongoing wellness visits.  I hope you feel better!

## 2022-11-13 ENCOUNTER — Other Ambulatory Visit: Payer: Self-pay

## 2022-11-13 ENCOUNTER — Ambulatory Visit (HOSPITAL_COMMUNITY)
Admission: EM | Admit: 2022-11-13 | Discharge: 2022-11-13 | Disposition: A | Discharge status: Home / Self Care | Payer: Self-pay | Attending: Family Medicine | Admitting: Family Medicine

## 2022-11-13 ENCOUNTER — Ambulatory Visit (INDEPENDENT_AMBULATORY_CARE_PROVIDER_SITE_OTHER): Payer: Self-pay

## 2022-11-13 ENCOUNTER — Encounter (HOSPITAL_COMMUNITY): Payer: Self-pay

## 2022-11-13 DIAGNOSIS — R062 Wheezing: Secondary | ICD-10-CM

## 2022-11-13 DIAGNOSIS — R071 Chest pain on breathing: Secondary | ICD-10-CM

## 2022-11-13 MED ORDER — PREDNISONE 20 MG PO TABS: 40.0000 mg | ORAL_TABLET | Freq: Every day | ORAL | 0 refills | Status: DC

## 2022-11-13 NOTE — ED Triage Notes (Signed)
Patient states he has had constant chest pressure  x 3 days. Patient states it is hard to catch his breath and take a deep breath. Pressure worsens with deep breath.

## 2022-11-13 NOTE — Discharge Instructions (Addendum)
You have been seen at the [facility] today for chest pain. Your evaluation today was not suggestive of any emergent condition requiring medical intervention at this time. Your ECG (heart tracing) and chest x-ray did not show any worrisome changes. However, some medical problems make take more time to appear. Therefore, it's very important that you pay attention to any new symptoms or worsening of your current condition.  Please proceed directly to the Emergency Department immediately should you feel worse in any way or have any of the following symptoms: increasing or different chest pain, pain that spreads to your arm, neck, jaw, back or abdomen, shortness of breath, or nausea and vomiting.  

## 2022-11-14 NOTE — ED Provider Notes (Signed)
[place]   [id number] 11/13/22 Arrival Time: 3500  ASSESSMENT & PLAN:  1. Pain of anterior chest wall with respiration   2. Wheezing    No respiratory distress. Symptoms possibly related to beginning of viral URI; discussed. ECG NSR; no acute changes. I have personally viewed the imaging studies ordered this visit. No acute changes on CXR. No pneumothorax.  Begin trial of: Meds ordered this encounter  Medications   predniSONE (DELTASONE) 20 MG tablet    Sig: Take 2 tablets (40 mg total) by mouth daily.    Dispense:  10 tablet    Refill:  0   Recommend:  Follow-up Information     [facility] at [facility].   Specialty: Emergency Medicine Why: If worsening or failing to improve as anticipated. Contact information: [address] [id number] [place] [phone]                Reviewed expectations re: course of current medical issues. Questions answered. Outlined signs and symptoms indicating need for more acute intervention. Patient verbalized understanding. After Visit Summary given.  SUBJECTIVE: History from: patient.  Andre Jenkins is a 37 y.o. male who presents with complaint of constant chest pressure x 2-3 days. Patient states it is hard to catch his breath and take a deep breath. Pressure worsens with deep breath. Mild body aches. Is fatigued. Afebrile.  Social History   Tobacco Use  Smoking Status Every Day   Packs/day: 0.50   Years: 8.00   Total pack years: 4.00   Types: Cigarettes  Smokeless Tobacco Never    OBJECTIVE:  Vitals:   11/13/22 1339  BP: 136/85  Pulse: 63  Resp: 16  Temp: 97.8 F (36.6 C)  TempSrc: Oral  SpO2: 96%     General appearance: alert; NAD HEENT: [place]; AT; without nasal congestion Neck: supple without LAD Cv: RRR without murmer Lungs: unlabored respirations, mild bilateral expiratory wheezing; cough: absent; no significant  respiratory distress Skin: warm and dry Psychological: alert and cooperative; normal mood and affect  Imaging: DG Chest 2 View  Result Date: 11/13/2022 CLINICAL DATA:  Chest pain for 3 days. EXAM: CHEST - 2 VIEW COMPARISON:  October 08, 2019 FINDINGS: The heart size and mediastinal contours are within normal limits. Both lungs are clear. The visualized skeletal structures are unremarkable. IMPRESSION: No active cardiopulmonary disease. Electronically Signed   By: Abelardo Diesel M.D.   On: 11/13/2022 14:07    No Known Allergies  Past Medical History:  Diagnosis Date   Pericarditis    when he was 37years old   Family History  Problem Relation Age of Onset   Hypertension Mother    Arthritis Mother    Arthritis Father    Social History   Socioeconomic History   Marital status: Single    Spouse name: Not on file   Number of children: Not on file   Years of education: Not on file   Highest education level: Not on file  Occupational History   Not on file  Tobacco Use   Smoking status: Every Day    Packs/day: 0.50    Years: 8.00    Total pack years: 4.00    Types: Cigarettes   Smokeless tobacco: Never  Vaping Use   Vaping Use: Never used  Substance and Sexual Activity   Alcohol use: Yes    Comment: occasionally   Drug use: Yes    Types: Marijuana    Comment: marijuana  Sexual activity: Not on file  Other Topics Concern   Not on file  Social History Narrative   Not on file   Social Determinants of Health   Financial Resource Strain: Not on file  Food Insecurity: Not on file  Transportation Needs: Not on file  Physical Activity: Not on file  Stress: Not on file  Social Connections: Not on file  Intimate Partner Violence: Not on file             Vanessa Kick, MD 11/14/22 [phone]

## 2023-03-30 ENCOUNTER — Ambulatory Visit (HOSPITAL_COMMUNITY)
Admission: EM | Admit: 2023-03-30 | Discharge: 2023-03-30 | Disposition: A | Discharge status: Home / Self Care | Payer: Medicaid Other | Attending: Physician Assistant | Admitting: Physician Assistant

## 2023-03-30 ENCOUNTER — Encounter (HOSPITAL_COMMUNITY): Payer: Self-pay

## 2023-03-30 DIAGNOSIS — K921 Melena: Secondary | ICD-10-CM

## 2023-03-30 DIAGNOSIS — M546 Pain in thoracic spine: Secondary | ICD-10-CM | POA: Diagnosis not present

## 2023-03-30 DIAGNOSIS — M62830 Muscle spasm of back: Secondary | ICD-10-CM | POA: Diagnosis not present

## 2023-03-30 LAB — POC HEMOCCULT BLD/STL (OFFICE/1-CARD/DIAGNOSTIC): Fecal Occult Blood, POC: NEGATIVE

## 2023-03-30 MED ORDER — HYDROCORTISONE ACETATE 25 MG RE SUPP: 25.0000 mg | Freq: Two times a day (BID) | RECTAL | 0 refills | Status: DC

## 2023-03-30 MED ORDER — LIDOCAINE 5 % EX PTCH: 1.0000 | MEDICATED_PATCH | CUTANEOUS | 0 refills | Status: DC

## 2023-03-30 MED ORDER — TIZANIDINE HCL 4 MG PO TABS: 4.0000 mg | ORAL_TABLET | Freq: Three times a day (TID) | ORAL | 0 refills | Status: DC | PRN

## 2023-03-30 NOTE — ED Triage Notes (Signed)
Pt c/o diarrhea x3 days. States has seen dark red blood in stool a few times. States has lower abdominal pain prior to diarrhea and relief afterwards.   Pt c/o upper back pain off and on for past year but hurting worse in the past 3 days. Denies taking any meds.

## 2023-03-30 NOTE — Discharge Instructions (Addendum)
I believe that your back pain is related to a muscle spasm.  Take tizanidine up to 3 times a day.  This will make you sleepy so do not drive or drink alcohol with taking it.  Use lidocaine patches during the day for up to 12 hours and then remove this for minimum of 12 hours; use 1 patch per 24 hours.  You can use Tylenol ibuprofen for pain relief.  Use heat and massage for additional symptom life.  If your symptoms are not improving please follow-up with sports medicine.  When we checked for blood in the stool today there was none which is great news.  It is possible that you have internal hemorrhoids.  Use hydrocortisone suppositories twice daily.  Make sure that you are eating plenty of fiber to avoid constipation.  Follow-up with gastroenterology; call to schedule an appointment.  If anything worsens you have recurrent blood, abdominal pain, fevers, shortness of breath, chest pain, heart racing you need to be seen immediately.

## 2023-03-30 NOTE — ED Provider Notes (Signed)
[facility]    CSN: [id number] Arrival date & time: 03/30/23  1059      History   Chief Complaint Chief Complaint  Patient presents with   Diarrhea    HPI Andre Jenkins is a 37 y.o. male.   Patient presents today with several concerns.  He reports a 3-day history of loose and bloody stools.  Reports that this is bright red blood on the outside of his stool but does not feel the toilet water.  There is a small amount on the toilet tissue.  He denies any associated melena.  Does report that he has some abdominal cramping and pain but this resolves following a bowel movement.  When his symptoms first began he did have an episode of nausea and vomiting but this has also resolved.  He reports having an episode of bloody stools many years ago but did not seek evaluation as it resolved on its own.  Denies diagnosis of ulcerative colitis, Crohn's disease, hemorrhoids.  Denies any weight loss, palpitations, shortness of breath, diaphoresis.  Denies any changes to personal hygiene products including soaps or detergents.  He has not tried any over-the-counter medication for symptom management.  He has never seen a GI specialist.  In addition, he reports a 1 year history of intermittent thoracic back pain.  He reports that at pain is rated 6 on a 0-10 pain scale, described as aching, localized to his right thoracic region near his scapula, no alleviating factors notified.  He has tried IcyHot, massage, heat without improvement of symptoms.  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  He denies any specific injury or change in activity prior to symptom onset.  Denies any associated numbness or paresthesias.    Past Medical History:  Diagnosis Date   Pericarditis    when he was 37years old    Patient Active Problem List   Diagnosis Date Noted   Leg wound, left 03/19/2014   Ankle fracture, left 12/19/2013    Pain in joint, ankle and foot 12/19/2013    Past Surgical History:  Procedure Laterality Date   ANKLE FRACTURE SURGERY     twice 2009       Home Medications    Prior to Admission medications   Medication Sig Start Date End Date Taking? Authorizing Provider  hydrocortisone (ANUSOL-HC) 25 MG suppository Place 1 suppository (25 mg total) rectally 2 (two) times daily. 03/30/23  Yes Spiros Greenfeld K, PA-C  lidocaine (LIDODERM) 5 % Place 1 patch onto the skin daily. Remove & Discard patch within 12 hours or as directed by MD 03/30/23  Yes Shelley Cocke, Denny Peon K, PA-C  tiZANidine (ZANAFLEX) 4 MG tablet Take 1 tablet (4 mg total) by mouth every 8 (eight) hours as needed for muscle spasms. 03/30/23  Yes Aralyn Nowak, Noberto Retort, PA-C    Family History Family History  Problem Relation Age of Onset   Hypertension Mother    Arthritis Mother    Arthritis Father     Social History Social History   Tobacco Use   Smoking status: Every Day    Packs/day: 0.50    Years: 8.00    Additional pack years: 0.00    Total pack years: 4.00    Types: Cigarettes   Smokeless tobacco: Never  Vaping Use   Vaping Use: Never used  Substance Use Topics   Alcohol use: Yes    Comment: occasionally  Drug use: Yes    Types: Marijuana    Comment: marijuana     Allergies   Patient has no known allergies.   Review of Systems Review of Systems  Constitutional:  Positive for activity change. Negative for appetite change, fatigue and fever.  Gastrointestinal:  Positive for abdominal pain and blood in stool. Negative for constipation, diarrhea, nausea and vomiting.  Musculoskeletal:  Positive for back pain. Negative for arthralgias and myalgias.  Neurological:  Negative for weakness and numbness.     Physical Exam Triage Vital Signs ED Triage Vitals  Enc Vitals Group     BP 03/30/23 1114 129/79     Pulse Rate 03/30/23 1114 75     Resp 03/30/23 1114 18     Temp 03/30/23 1114 98.1 F (36.7 C)     Temp Source  03/30/23 1114 Oral     SpO2 03/30/23 1114 97 %     Weight --      Height --      Head Circumference --      Peak Flow --      Pain Score 03/30/23 1115 0     Pain Loc --      Pain Edu? --      Excl. in GC? --    No data found.  Updated Vital Signs BP 129/79 (BP Location: Left Arm)   Pulse 75   Temp 98.1 F (36.7 C) (Oral)   Resp 18   SpO2 97%   Visual Acuity Right Eye Distance:   Left Eye Distance:   Bilateral Distance:    Right Eye Near:   Left Eye Near:    Bilateral Near:     Physical Exam Vitals reviewed.  Constitutional:      General: He is awake.     Appearance: Normal appearance. He is well-developed. He is not ill-appearing.     Comments: Very pleasant male appears stated age in no acute distress sitting comfortably in exam room  HENT:     Head: Normocephalic and atraumatic.  Cardiovascular:     Rate and Rhythm: Normal rate and regular rhythm.     Heart sounds: Normal heart sounds, S1 normal and S2 normal. No murmur heard. Pulmonary:     Effort: Pulmonary effort is normal.     Breath sounds: Normal breath sounds. No stridor. No wheezing, rhonchi or rales.     Comments: Clear to auscultation bilaterally Abdominal:     General: Bowel sounds are normal.     Palpations: Abdomen is soft.     Tenderness: There is no abdominal tenderness. There is no right CVA tenderness, left CVA tenderness, guarding or rebound.  Genitourinary:    Rectum: No tenderness, anal fissure, external hemorrhoid or internal hemorrhoid.     Comments: Alyson, RT present as chaperone during exam Musculoskeletal:     Cervical back: Normal. No tenderness or bony tenderness.     Thoracic back: Spasms and tenderness present. No bony tenderness.     Lumbar back: No tenderness or bony tenderness.       Back:     Comments: Back: No pain percussion of vertebrae.  No deformity or step-off noted.  Tenderness palpation with associated spasm noted right thoracic paraspinal muscles.  Normal active  range of motion of all major joints.  Strength 5/5 bilateral upper and lower extremities.  Neurological:     Mental Status: He is alert.  Psychiatric:        Behavior: Behavior is cooperative.  UC Treatments / Results  Labs (all labs ordered are listed, but only abnormal results are displayed) Labs Reviewed  POC HEMOCCULT BLD/STL (OFFICE/1-CARD/DIAGNOSTIC)    EKG   Radiology No results found.  Procedures Procedures (including critical care time)  Medications Ordered in UC Medications - No data to display  Initial Impression / Assessment and Plan / UC Course  I have reviewed the triage vital signs and the nursing notes.  Pertinent labs & imaging results that were available during my care of the patient were reviewed by me and considered in my medical decision making (see chart for details).     Patient is well-appearing, afebrile, nontoxic, nontachycardic.  Thoracic back pain is likely related to musculoskeletal injury and muscle spasm.  Plain films were deferred as he had no bony tenderness and denies any recent trauma.  Will attempt treatment with tizanidine up to 3 times a day.  Discussed that this can be sedating and he should not drive or drink alcohol with taking it.  He can use lidocaine patches for additional symptom relief.  He can use over-the-counter medication including Tylenol ibuprofen.  Also recommended heat, rest, stretch.  If symptoms or not improving quickly he is to follow-up with sports medicine and was given contact information for local provider with instruction to call to schedule an appointment.  Discussed that if he has any worsening or changing symptoms he should return for reevaluation.  Patient is well-appearing, afebrile, nontoxic, nontachycardic.  No obvious internal hemorrhoid on rectal exam.  Fecal occult was negative in clinic.  Discussed that he might of had an internal hemorrhoid that was slightly enlarged before but has since improved.  Will  treat with suppositories of hydrocortisone and he was encouraged to use dietary and lifestyle modification to avoid constipation.  Recommended he follow-up with GI and was given contact information for local provider.  Discussed that if he has any worsening symptoms including recurrent blood, bright red blood per rectum, fever, nausea, vomiting, severe abdominal pain he needs to be seen immediately.  Strict return precautions given.  Work excuse note provided.  Final Clinical Impressions(s) / UC Diagnoses   Final diagnoses:  Acute right-sided thoracic back pain  Spasm of thoracic back muscle  Blood in stool     Discharge Instructions      I believe that your back pain is related to a muscle spasm.  Take tizanidine up to 3 times a day.  This will make you sleepy so do not drive or drink alcohol with taking it.  Use lidocaine patches during the day for up to 12 hours and then remove this for minimum of 12 hours; use 1 patch per 24 hours.  You can use Tylenol ibuprofen for pain relief.  Use heat and massage for additional symptom life.  If your symptoms are not improving please follow-up with sports medicine.  When we checked for blood in the stool today there was none which is great news.  It is possible that you have internal hemorrhoids.  Use hydrocortisone suppositories twice daily.  Make sure that you are eating plenty of fiber to avoid constipation.  Follow-up with gastroenterology; call to schedule an appointment.  If anything worsens you have recurrent blood, abdominal pain, fevers, shortness of breath, chest pain, heart racing you need to be seen immediately.       ED Prescriptions     Medication Sig Dispense Auth. Provider   tiZANidine (ZANAFLEX) 4 MG tablet Take 1 tablet (4 mg total) by  mouth every 8 (eight) hours as needed for muscle spasms. 30 tablet Yentl Verge K, PA-C   lidocaine (LIDODERM) 5 % Place 1 patch onto the skin daily. Remove & Discard patch within 12 hours or as  directed by MD 30 patch Skarleth Delmonico K, PA-C   hydrocortisone (ANUSOL-HC) 25 MG suppository Place 1 suppository (25 mg total) rectally 2 (two) times daily. 12 suppository Lanee Chain, Noberto Retort, PA-C      PDMP not reviewed this encounter.   Jeani Hawking, PA-C 03/30/23 1206

## 2023-05-24 ENCOUNTER — Ambulatory Visit (HOSPITAL_COMMUNITY)
Admission: EM | Admit: 2023-05-24 | Discharge: 2023-05-24 | Disposition: A | Discharge status: Home / Self Care | Payer: Medicaid Other | Attending: Family Medicine | Admitting: Family Medicine

## 2023-05-24 ENCOUNTER — Encounter (HOSPITAL_COMMUNITY): Payer: Self-pay

## 2023-05-24 DIAGNOSIS — M25572 Pain in left ankle and joints of left foot: Secondary | ICD-10-CM

## 2023-05-24 DIAGNOSIS — G8929 Other chronic pain: Secondary | ICD-10-CM | POA: Diagnosis not present

## 2023-05-24 MED ORDER — MUPIROCIN 2 % EX OINT: 1.0000 | TOPICAL_OINTMENT | Freq: Two times a day (BID) | CUTANEOUS | 0 refills | Status: AC

## 2023-05-24 NOTE — Discharge Instructions (Signed)
Put mupirocin ointment on the sore area twice daily when it is more inflamed   You can use the QR code/website at the back of the summary paperwork to schedule yourself a new patient appointment with primary care
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
## 2023-05-24 NOTE — ED Triage Notes (Signed)
Pt reports he has left side ankle pain and  swelling and bleeding due to an old incision from pins implanted in his left ankle x 1 day States he missed work due to this needs a note

## 2023-11-26 ENCOUNTER — Ambulatory Visit (HOSPITAL_COMMUNITY)
Admission: EM | Admit: 2023-11-26 | Discharge: 2023-11-26 | Disposition: A | Discharge status: Home / Self Care | Payer: Medicaid Other | Attending: Emergency Medicine | Admitting: Emergency Medicine

## 2023-11-26 ENCOUNTER — Encounter (HOSPITAL_COMMUNITY): Payer: Self-pay

## 2023-11-26 ENCOUNTER — Ambulatory Visit (INDEPENDENT_AMBULATORY_CARE_PROVIDER_SITE_OTHER): Payer: Medicaid Other

## 2023-11-26 DIAGNOSIS — R0602 Shortness of breath: Secondary | ICD-10-CM

## 2023-11-26 DIAGNOSIS — R051 Acute cough: Secondary | ICD-10-CM

## 2023-11-26 DIAGNOSIS — J069 Acute upper respiratory infection, unspecified: Secondary | ICD-10-CM | POA: Diagnosis not present

## 2023-11-26 MED ORDER — IPRATROPIUM-ALBUTEROL 0.5-2.5 (3) MG/3ML IN SOLN
3.0000 mL | Freq: Once | RESPIRATORY_TRACT | Status: AC
  Administered 2023-11-26: 3 mL via RESPIRATORY_TRACT

## 2023-11-26 MED ORDER — AZITHROMYCIN 250 MG PO TABS: ORAL_TABLET | ORAL | 0 refills | Status: AC

## 2023-11-26 MED ORDER — IPRATROPIUM-ALBUTEROL 0.5-2.5 (3) MG/3ML IN SOLN
RESPIRATORY_TRACT | Status: AC
  Filled 2023-11-26: qty 3

## 2023-11-26 MED ORDER — ALBUTEROL SULFATE HFA 108 (90 BASE) MCG/ACT IN AERS: 1.0000 | INHALATION_SPRAY | Freq: Four times a day (QID) | RESPIRATORY_TRACT | 0 refills | Status: AC | PRN

## 2023-11-26 NOTE — Discharge Instructions (Addendum)
 Start taking azithromycin  by taking 2 tablets today and 1 tablet on the remaining 4 days. I have also sent an albuterol  inhaler to use as needed for shortness of breath and chest tightness.  Otherwise alternate between Tylenol and ibuprofen  as needed for pain and fever.  I also recommend taking Mucinex to help with cough and congestion.  Return here if symptoms persist or worsen.

## 2023-11-26 NOTE — ED Provider Notes (Signed)
 [facility]    CSN: [id number] Arrival date & time: 11/26/23  1652      History   Chief Complaint Chief Complaint  Patient presents with   Shortness of Breath    HPI Andre Jenkins is a 38 y.o. male.   Patient presents with shortness of breath, cough, and congestion x 1 week.  Patient states that he has pain to his back with deep breathing.  Denies fever, chest pain, abdominal pain, nausea, vomiting, and diarrhea.   Shortness of Breath   Past Medical History:  Diagnosis Date   Pericarditis    when he was 38years old    Patient Active Problem List   Diagnosis Date Noted   Leg wound, left 03/19/2014   Ankle fracture, left 12/19/2013   Pain in joint, ankle and foot 12/19/2013    Past Surgical History:  Procedure Laterality Date   ANKLE FRACTURE SURGERY     twice 2009       Home Medications    Prior to Admission medications   Medication Sig Start Date End Date Taking? Authorizing Provider  albuterol  (VENTOLIN  HFA) 108 (90 Base) MCG/ACT inhaler Inhale 1-2 puffs into the lungs every 6 (six) hours as needed for wheezing or shortness of breath. 11/26/23  Yes Rosevelt Constable, Nadja Lina A, NP  azithromycin  (ZITHROMAX  Z-PAK) 250 MG tablet Take 2 pills (500mg ) first day and one pill (250mg ) the remaining 4 days. 11/26/23  Yes Rosevelt Constable, Jiovany Scheffel A, NP  mupirocin  ointment (BACTROBAN ) 2 % Apply 1 Application topically 2 (two) times daily. To affected area till better 05/24/23   Ann Keto, MD    Family History Family History  Problem Relation Age of Onset   Hypertension Mother    Arthritis Mother    Arthritis Father     Social History Social History   Tobacco Use   Smoking status: Every Day    Current packs/day: 0.50    Average packs/day: 0.5 packs/day for 8.0 years (4.0 ttl pk-yrs)    Types: Cigarettes   Smokeless tobacco: Never  Vaping Use   Vaping status: Never Used  Substance Use Topics   Alcohol use: Yes    Comment: occasionally   Drug use: Yes     Types: Marijuana    Comment: marijuana     Allergies   Patient has no known allergies.   Review of Systems Review of Systems  Respiratory:  Positive for shortness of breath.    Per HPI  Physical Exam Triage Vital Signs ED Triage Vitals  Encounter Vitals Group     BP 11/26/23 1841 (!) 148/85     Systolic BP Percentile --      Diastolic BP Percentile --      Pulse Rate 11/26/23 1841 73     Resp 11/26/23 1841 18     Temp 11/26/23 1841 98 F (36.7 C)     Temp Source 11/26/23 1841 Oral     SpO2 11/26/23 1841 98 %     Weight --      Height --      Head Circumference --      Peak Flow --      Pain Score 11/26/23 1842 4     Pain Loc --      Pain Education --      Exclude from Growth Chart --    No data found.  Updated Vital Signs BP (!) 148/85 (BP Location: Left Arm)   Pulse 73   Temp 98 F (36.7  C) (Oral)   Resp 18   SpO2 98%   Visual Acuity Right Eye Distance:   Left Eye Distance:   Bilateral Distance:    Right Eye Near:   Left Eye Near:    Bilateral Near:     Physical Exam Vitals and nursing note reviewed.  Constitutional:      General: He is awake. He is not in acute distress.    Appearance: Normal appearance. He is well-developed and well-groomed. He is not ill-appearing.  HENT:     Right Ear: Tympanic membrane, ear canal and external ear normal.     Left Ear: Tympanic membrane, ear canal and external ear normal.     Nose: Congestion and rhinorrhea present.     Mouth/Throat:     Mouth: Mucous membranes are moist.     Pharynx: Posterior oropharyngeal erythema present. No oropharyngeal exudate.  Cardiovascular:     Rate and Rhythm: Normal rate and regular rhythm.  Pulmonary:     Effort: Pulmonary effort is normal.     Breath sounds: Decreased breath sounds present. No wheezing, rhonchi or rales.  Musculoskeletal:        General: Normal range of motion.     Cervical back: Normal range of motion and neck supple.  Skin:    General: Skin is warm  and dry.  Neurological:     Mental Status: He is alert.  Psychiatric:        Behavior: Behavior is cooperative.      UC Treatments / Results  Labs (all labs ordered are listed, but only abnormal results are displayed) Labs Reviewed - No data to display  EKG   Radiology DG Chest 2 View Result Date: 11/26/2023 CLINICAL DATA:  Cough and shortness of breath. EXAM: CHEST - 2 VIEW COMPARISON:  November 13, 2022 FINDINGS: The heart size and mediastinal contours are within normal limits. Both lungs are clear. The visualized skeletal structures are unremarkable. IMPRESSION: No active cardiopulmonary disease. Electronically Signed   By: Virgle Grime M.D.   On: 11/26/2023 20:34    Procedures Procedures (including critical care time)  Medications Ordered in UC Medications  ipratropium-albuterol  (DUONEB) 0.5-2.5 (3) MG/3ML nebulizer solution 3 mL (3 mLs Nebulization Given 11/26/23 1943)    Initial Impression / Assessment and Plan / UC Course  I have reviewed the triage vital signs and the nursing notes.  Pertinent labs & imaging results that were available during my care of the patient were reviewed by me and considered in my medical decision making (see chart for details).     Patient present with shortness of breath, cough, and congestion x 1 week.  Patient endorses pain to his back with deep breathing.  Denies any other symptoms.  Upon assessment congestion and rhinorrhea are present, mild erythema noted to pharynx.  Decreased breath sounds auscultated throughout without wheezing, rhonchi, and rales.  Given DuoNeb with increased breath sounds and some relief of shortness of breath.  Chest x-ray did not reveal any obvious pneumonia.  Prescribed azithromycin  for persistent upper respiratory infection.  Prescribed albuterol  inhaler as needed for shortness of breath and chest tightness.  Discussed over-the-counter medication for other symptoms.  Discussed return precautions. Final  Clinical Impressions(s) / UC Diagnoses   Final diagnoses:  Acute cough  Shortness of breath  Acute upper respiratory infection     Discharge Instructions      Start taking azithromycin  by taking 2 tablets today and 1 tablet on the remaining 4 days. I have also  sent an albuterol  inhaler to use as needed for shortness of breath and chest tightness.  Otherwise alternate between Tylenol and ibuprofen  as needed for pain and fever.  I also recommend taking Mucinex to help with cough and congestion.  Return here if symptoms persist or worsen.     ED Prescriptions     Medication Sig Dispense Auth. Provider   azithromycin  (ZITHROMAX  Z-PAK) 250 MG tablet Take 2 pills (500mg ) first day and one pill (250mg ) the remaining 4 days. 6 tablet Levora Reas A, NP   albuterol  (VENTOLIN  HFA) 108 (90 Base) MCG/ACT inhaler Inhale 1-2 puffs into the lungs every 6 (six) hours as needed for wheezing or shortness of breath. 8 g Levora Reas A, NP      PDMP not reviewed this encounter.   Levora Reas A, NP 11/26/23 2037

## 2023-11-26 NOTE — ED Triage Notes (Signed)
 Pt c/o SOB with pain to center of back when taking a deep breath x1wk after having a cough. States feels pressure to back. NAD

## 2024-06-10 ENCOUNTER — Ambulatory Visit
Admission: EM | Admit: 2024-06-10 | Discharge: 2024-06-10 | Disposition: A | Discharge status: Home / Self Care | Attending: Nurse Practitioner | Admitting: Nurse Practitioner

## 2024-06-10 DIAGNOSIS — K047 Periapical abscess without sinus: Secondary | ICD-10-CM

## 2024-06-10 DIAGNOSIS — K0889 Other specified disorders of teeth and supporting structures: Secondary | ICD-10-CM | POA: Diagnosis not present

## 2024-06-10 MED ORDER — AMOXICILLIN-POT CLAVULANATE 875-125 MG PO TABS: 1.0000 | ORAL_TABLET | Freq: Two times a day (BID) | ORAL | 0 refills | Status: AC

## 2024-06-10 MED ORDER — IBUPROFEN 800 MG PO TABS: 800.0000 mg | ORAL_TABLET | Freq: Three times a day (TID) | ORAL | 0 refills | Status: AC

## 2024-06-10 NOTE — ED Triage Notes (Signed)
 Pt reports dental pain on the right side swelling is present, has been causing pain x 2 days has tried ibuprofen  but has found no relief

## 2024-06-10 NOTE — Discharge Instructions (Signed)
 Take medication as prescribed.  Make sure you take the ibuprofen  with food and water. May take over-the-counter Tylenol extra strength 1 to 2 hours after ibuprofen  for breakthrough pain. Warm salt water gargles 3-4 times daily until symptoms improve. Recommend warm compresses to the affected area to help with pain and discomfort.  You may use cool compresses to help with pain or swelling. I have provided a dental resource guide for you to follow-up with a dentist.  Recommend following up within the next 7 to 10 days.  Follow-up as needed.

## 2024-06-10 NOTE — ED Provider Notes (Signed)
 [facility]    CSN: [id number] Arrival date & time: 06/10/24  1227      History   Chief Complaint No chief complaint on file.   HPI Andre Jenkins is a 38 y.o. male.   The history is provided by the patient.   Patient presents for complaints of right sided dental pain and facial swelling.  Patient states symptoms started approximately 2 days ago.  Patient states that he has a tooth that is cracked in the middle and the right upper portion of his mouth.  Patient states that he felt like he was warm a couple of days ago, but that has since resolved, further denies chills, headache, ear pain, chest pain, abdominal pain, nausea, vomiting, diarrhea, or rash.  Patient states he has been taking ibuprofen  for his symptoms.  States that he does have an appointment with the dentist, but not until November.  Past Medical History:  Diagnosis Date   Pericarditis    when he was 38years old    Patient Active Problem List   Diagnosis Date Noted   Leg wound, left 03/19/2014   Ankle fracture, left 12/19/2013   Pain in joint, ankle and foot 12/19/2013    Past Surgical History:  Procedure Laterality Date   ANKLE FRACTURE SURGERY     twice 2009       Home Medications    Prior to Admission medications   Medication Sig Start Date End Date Taking? Authorizing Provider  amoxicillin -clavulanate (AUGMENTIN ) 875-125 MG tablet Take 1 tablet by mouth every 12 (twelve) hours. 06/10/24  Yes Leath-Warren, Etta PARAS, NP  ibuprofen  (ADVIL ) 800 MG tablet Take 1 tablet (800 mg total) by mouth 3 (three) times daily. 06/10/24  Yes Leath-Warren, Etta PARAS, NP  albuterol  (VENTOLIN  HFA) 108 (90 Base) MCG/ACT inhaler Inhale 1-2 puffs into the lungs every 6 (six) hours as needed for wheezing or shortness of breath. 11/26/23   Johnie Flaming A, NP  azithromycin  (ZITHROMAX  Z-PAK) 250 MG tablet Take 2 pills (500mg ) first day and one pill (250mg ) the remaining 4 days. 11/26/23   Johnie Flaming A, NP   mupirocin  ointment (BACTROBAN ) 2 % Apply 1 Application topically 2 (two) times daily. To affected area till better 05/24/23   Vonna Sharlet POUR, MD    Family History Family History  Problem Relation Age of Onset   Hypertension Mother    Arthritis Mother    Arthritis Father     Social History Social History   Tobacco Use   Smoking status: Every Day    Current packs/day: 0.50    Average packs/day: 0.5 packs/day for 8.0 years (4.0 ttl pk-yrs)    Types: Cigarettes   Smokeless tobacco: Never  Vaping Use   Vaping status: Never Used  Substance Use Topics   Alcohol use: Yes    Comment: occasionally   Drug use: Yes    Types: Marijuana    Comment: marijuana     Allergies   Patient has no known allergies.   Review of Systems Review of Systems Per HPI  Physical Exam Triage Vital Signs ED Triage Vitals  Encounter Vitals Group     BP 06/10/24 1245 (!) 134/91     Girls Systolic BP Percentile --      Girls Diastolic BP Percentile --      Boys Systolic BP Percentile --      Boys Diastolic BP Percentile --      Pulse Rate 06/10/24 1245 (!) 103     Resp  06/10/24 1245 16     Temp 06/10/24 1245 98.5 F (36.9 C)     Temp src --      SpO2 06/10/24 1245 97 %     Weight --      Height --      Head Circumference --      Peak Flow --      Pain Score 06/10/24 1247 3     Pain Loc --      Pain Education --      Exclude from Growth Chart --    No data found.  Updated Vital Signs BP (!) 134/91 (BP Location: Right Arm)   Pulse (!) 103   Temp 98.5 F (36.9 C)   Resp 16   SpO2 97%   Visual Acuity Right Eye Distance:   Left Eye Distance:   Bilateral Distance:    Right Eye Near:   Left Eye Near:    Bilateral Near:     Physical Exam Vitals and nursing note reviewed.  Constitutional:      General: He is not in acute distress.    Appearance: Normal appearance.  HENT:     Head: Normocephalic.     Jaw: Swelling present.      Right Ear: Tympanic membrane, ear canal  and external ear normal.     Left Ear: Tympanic membrane and external ear normal.     Nose: Nose normal.     Mouth/Throat:     Lips: Pink.     Mouth: Mucous membranes are moist.     Dentition: Dental tenderness present.   Eyes:     Extraocular Movements: Extraocular movements intact.     Pupils: Pupils are equal, round, and reactive to light.  Cardiovascular:     Rate and Rhythm: Normal rate and regular rhythm.     Pulses: Normal pulses.     Heart sounds: Normal heart sounds.  Pulmonary:     Effort: Pulmonary effort is normal.     Breath sounds: Normal breath sounds.  Musculoskeletal:     Cervical back: Normal range of motion.  Skin:    General: Skin is warm and dry.  Neurological:     General: No focal deficit present.     Mental Status: He is alert and oriented to person, place, and time.  Psychiatric:        Mood and Affect: Mood normal.        Behavior: Behavior normal.      UC Treatments / Results  Labs (all labs ordered are listed, but only abnormal results are displayed) Labs Reviewed - No data to display  EKG   Radiology No results found.  Procedures Procedures (including critical care time)  Medications Ordered in UC Medications - No data to display  Initial Impression / Assessment and Plan / UC Course  I have reviewed the triage vital signs and the nursing notes.  Pertinent labs & imaging results that were available during my care of the patient were reviewed by me and considered in my medical decision making (see chart for details).  Patient presents for complaints of swelling and pain in the gums in the right upper portion of his mouth for the past 2 days.  On exam, patient has moderate mild gingival swelling to the gum in the right t posterior area of the upper mouth.  Will treat for dental abscess with Augmentin  875/125 mg tablets along with ibuprofen  800 mg for pain.  Supportive care recommendations were provided  and discussed with the patient to  include the additional Tylenol for severe pain or discomfort, warm salt water gargles, and the use of cool or cool compresses to help with pain or swelling.  Patient was advised it is recommended that he follow-up with a dentist within the next 7 to 10 days.  Dental resource guide was provided.  Patient was in agreement with this plan of care and verbalizes understanding.  All questions were answered.  Patient stable for discharge.  Final Clinical Impressions(s) / UC Diagnoses   Final diagnoses:  Dentalgia  Dental abscess     Discharge Instructions      Take medication as prescribed.  Make sure you take the ibuprofen  with food and water. May take over-the-counter Tylenol extra strength 1 to 2 hours after ibuprofen  for breakthrough pain. Warm salt water gargles 3-4 times daily until symptoms improve. Recommend warm compresses to the affected area to help with pain and discomfort.  You may use cool compresses to help with pain or swelling. I have provided a dental resource guide for you to follow-up with a dentist.  Recommend following up within the next 7 to 10 days.  Follow-up as needed.     ED Prescriptions     Medication Sig Dispense Auth. Provider   amoxicillin -clavulanate (AUGMENTIN ) 875-125 MG tablet Take 1 tablet by mouth every 12 (twelve) hours. 14 tablet Leath-Warren, Etta PARAS, NP   ibuprofen  (ADVIL ) 800 MG tablet Take 1 tablet (800 mg total) by mouth 3 (three) times daily. 21 tablet Leath-Warren, Etta PARAS, NP      PDMP not reviewed this encounter.   Gilmer Etta PARAS, NP 06/10/24 1304
